# Patient Record
Sex: Female | Born: 1995 | Marital: Single
Health system: Southern US, Community
[De-identification: ages and names within clinical notes are randomized; demographics above are authoritative.]

---
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
## 2013-02-14 ENCOUNTER — Emergency Department: Payer: Self-pay | Admitting: Emergency Medicine

## 2013-03-16 ENCOUNTER — Inpatient Hospital Stay: Payer: Self-pay | Admitting: Internal Medicine

## 2013-03-16 LAB — URINALYSIS, COMPLETE
Bilirubin,UR: NEGATIVE
Glucose,UR: >=500 mg/dL (ref 0–75)
Leukocyte Esterase: NEGATIVE
Nitrite: NEGATIVE
Ph: 5 (ref 4.5–8.0)
Squamous Epithelial: 1
WBC UR: 4 /HPF (ref 0–5)

## 2013-03-16 LAB — COMPREHENSIVE METABOLIC PANEL
Albumin: 4.4 g/dL (ref 3.8–5.6)
Alkaline Phosphatase: 241 U/L — ABNORMAL HIGH
BUN: 17 mg/dL (ref 9–21)
Bilirubin,Total: 0.3 mg/dL (ref 0.2–1.0)
Calcium, Total: 8.3 mg/dL — ABNORMAL LOW (ref 9.0–10.7)
Chloride: 98 mmol/L (ref 97–107)
Co2: <5 mmol/L — CL (ref 16–25)
Creatinine: 1.1 mg/dL (ref 0.60–1.30)
EGFR (African American): >60
EGFR (Non-African Amer.): >60
Osmolality: 281 (ref 275–301)
Potassium: 4.9 mmol/L — ABNORMAL HIGH (ref 3.3–4.7)
SGPT (ALT): 33 U/L (ref 12–78)
Sodium: 126 mmol/L — ABNORMAL LOW (ref 132–141)
Total Protein: 8.4 g/dL (ref 6.4–8.6)

## 2013-03-16 LAB — CBC
HCT: 49.9 % — ABNORMAL HIGH (ref 35.0–47.0)
HGB: 15.7 g/dL (ref 12.0–16.0)
MCH: 28 pg (ref 26.0–34.0)
RBC: 5.63 10*6/uL — ABNORMAL HIGH (ref 3.80–5.20)
RDW: 13.1 % (ref 11.5–14.5)

## 2013-03-16 LAB — PREGNANCY, URINE: Pregnancy Test, Urine: NEGATIVE m[IU]/mL

## 2013-03-17 LAB — DRUG SCREEN, URINE
Barbiturates, Ur Screen: NEGATIVE (ref ?–200)
Cocaine Metabolite,Ur ~~LOC~~: NEGATIVE (ref ?–300)
Methadone, Ur Screen: NEGATIVE (ref ?–300)
Opiate, Ur Screen: NEGATIVE (ref ?–300)
Phencyclidine (PCP) Ur S: NEGATIVE (ref ?–25)
Tricyclic, Ur Screen: NEGATIVE (ref ?–1000)

## 2013-03-17 LAB — CBC WITH DIFFERENTIAL/PLATELET
Basophil %: 0.8 %
Eosinophil #: 0 10*3/uL (ref 0.0–0.7)
Eosinophil %: 0.1 %
HCT: 42.7 % (ref 35.0–47.0)
HGB: 14.2 g/dL (ref 12.0–16.0)
Lymphocyte #: 7.6 10*3/uL — ABNORMAL HIGH (ref 1.0–3.6)
Lymphocyte %: 15 %
Monocyte #: 2.1 x10 3/mm — ABNORMAL HIGH (ref 0.2–0.9)
Neutrophil #: 40.6 10*3/uL — ABNORMAL HIGH (ref 1.4–6.5)
Neutrophil %: 79.9 %
RDW: 12.8 % (ref 11.5–14.5)

## 2013-03-17 LAB — BASIC METABOLIC PANEL
Anion Gap: 16 (ref 7–16)
Anion Gap: 14 (ref 7–16)
BUN: 18 mg/dL (ref 9–21)
BUN: 14 mg/dL (ref 9–21)
BUN: 13 mg/dL (ref 9–21)
Calcium, Total: 8 mg/dL — ABNORMAL LOW (ref 9.0–10.7)
Chloride: 113 mmol/L — ABNORMAL HIGH (ref 97–107)
Co2: <5 mmol/L — CL (ref 16–25)
Co2: 9 mmol/L — CL (ref 16–25)
Creatinine: 0.85 mg/dL (ref 0.60–1.30)
Creatinine: 1.02 mg/dL (ref 0.60–1.30)
EGFR (African American): >60
EGFR (African American): >60
EGFR (African American): >60
EGFR (Non-African Amer.): >60
Glucose: 184 mg/dL — ABNORMAL HIGH (ref 65–99)
Osmolality: 274 (ref 275–301)
Sodium: 134 mmol/L (ref 132–141)
Sodium: 137 mmol/L (ref 132–141)

## 2013-03-17 LAB — COMPREHENSIVE METABOLIC PANEL
Calcium, Total: 7.5 mg/dL — ABNORMAL LOW (ref 9.0–10.7)
Chloride: 107 mmol/L (ref 97–107)
Co2: <5 mmol/L — CL (ref 16–25)
Glucose: 313 mg/dL — ABNORMAL HIGH (ref 65–99)
Potassium: 4.6 mmol/L (ref 3.3–4.7)
SGOT(AST): 16 U/L (ref 0–26)
Sodium: 132 mmol/L (ref 132–141)
Total Protein: 7.9 g/dL (ref 6.4–8.6)

## 2013-03-17 LAB — HEMOGLOBIN A1C: Hemoglobin A1C: 16 % — ABNORMAL HIGH (ref 4.2–6.3)

## 2013-03-18 LAB — CBC WITH DIFFERENTIAL/PLATELET
Basophil #: 0 10*3/uL (ref 0.0–0.1)
Basophil %: 0.1 %
Eosinophil %: 0.1 %
HGB: 13.3 g/dL (ref 12.0–16.0)
Lymphocyte %: 4.6 %
MCHC: 33.7 g/dL (ref 32.0–36.0)
Neutrophil #: 17.3 10*3/uL — ABNORMAL HIGH (ref 1.4–6.5)
Neutrophil %: 88 %
Platelet: 274 10*3/uL (ref 150–440)
RDW: 12.8 % (ref 11.5–14.5)
WBC: 19.6 10*3/uL — ABNORMAL HIGH (ref 3.6–11.0)

## 2013-03-18 LAB — BASIC METABOLIC PANEL
Anion Gap: 12 (ref 7–16)
Anion Gap: 19 — ABNORMAL HIGH (ref 7–16)
Anion Gap: 7 (ref 7–16)
BUN: 12 mg/dL (ref 9–21)
BUN: 13 mg/dL (ref 9–21)
BUN: 13 mg/dL (ref 9–21)
BUN: 14 mg/dL (ref 9–21)
Calcium, Total: 8.3 mg/dL — ABNORMAL LOW (ref 9.0–10.7)
Calcium, Total: 9.1 mg/dL (ref 9.0–10.7)
Chloride: 116 mmol/L — ABNORMAL HIGH (ref 97–107)
Chloride: 118 mmol/L — ABNORMAL HIGH (ref 97–107)
Co2: 10 mmol/L — CL (ref 16–25)
Co2: 13 mmol/L — ABNORMAL LOW (ref 16–25)
Creatinine: 1 mg/dL (ref 0.60–1.30)
Creatinine: 0.83 mg/dL (ref 0.60–1.30)
EGFR (African American): >60
EGFR (African American): >60
EGFR (African American): >60
EGFR (Non-African Amer.): >60
EGFR (Non-African Amer.): >60
EGFR (Non-African Amer.): >60
EGFR (Non-African Amer.): >60
Glucose: 205 mg/dL — ABNORMAL HIGH (ref 65–99)
Glucose: 340 mg/dL — ABNORMAL HIGH (ref 65–99)
Osmolality: 282 (ref 275–301)
Osmolality: 286 (ref 275–301)
Osmolality: 289 (ref 275–301)
Osmolality: 298 (ref 275–301)
Potassium: 3 mmol/L — ABNORMAL LOW (ref 3.3–4.7)
Potassium: 3.5 mmol/L (ref 3.3–4.7)
Sodium: 141 mmol/L (ref 132–141)

## 2013-03-18 LAB — URINE CULTURE

## 2013-03-19 LAB — CBC WITH DIFFERENTIAL/PLATELET
Basophil #: 0 10*3/uL (ref 0.0–0.1)
Eosinophil %: 0.7 %
HGB: 12.1 g/dL (ref 12.0–16.0)
Lymphocyte #: 3.1 10*3/uL (ref 1.0–3.6)
MCH: 28.4 pg (ref 26.0–34.0)
MCHC: 35 g/dL (ref 32.0–36.0)
MCV: 81 fL (ref 80–100)
Neutrophil %: 66.8 %
RBC: 4.25 10*6/uL (ref 3.80–5.20)
RDW: 12.9 % (ref 11.5–14.5)

## 2013-03-19 LAB — BASIC METABOLIC PANEL
Anion Gap: 11 (ref 7–16)
BUN: 12 mg/dL (ref 9–21)
Calcium, Total: 8.9 mg/dL — ABNORMAL LOW (ref 9.0–10.7)
Co2: 16 mmol/L (ref 16–25)
EGFR (African American): >60
EGFR (Non-African Amer.): >60
Potassium: 3.3 mmol/L (ref 3.3–4.7)

## 2013-03-20 LAB — BASIC METABOLIC PANEL
BUN: 14 mg/dL (ref 9–21)
Calcium, Total: 8.6 mg/dL — ABNORMAL LOW (ref 9.0–10.7)
Co2: 25 mmol/L (ref 16–25)
EGFR (African American): >60
Potassium: 3.4 mmol/L (ref 3.3–4.7)
Sodium: 140 mmol/L (ref 132–141)

## 2013-03-21 LAB — CULTURE, BLOOD (SINGLE)

## 2013-03-22 LAB — CULTURE, BLOOD (SINGLE)

## 2013-04-17 ENCOUNTER — Emergency Department: Payer: Self-pay | Admitting: Emergency Medicine

## 2013-04-22 ENCOUNTER — Inpatient Hospital Stay: Payer: Self-pay | Admitting: Surgery

## 2013-04-22 LAB — COMPREHENSIVE METABOLIC PANEL
ALK PHOS: 368 U/L — AB
Albumin: 2.7 g/dL — ABNORMAL LOW (ref 3.8–5.6)
Anion Gap: 14 (ref 7–16)
BUN: 15 mg/dL (ref 7–18)
Bilirubin,Total: 0.3 mg/dL (ref 0.2–1.0)
CALCIUM: 9.5 mg/dL (ref 9.0–10.7)
Chloride: 96 mmol/L — ABNORMAL LOW (ref 98–107)
Co2: 17 mmol/L — ABNORMAL LOW (ref 21–32)
Creatinine: 0.64 mg/dL (ref 0.60–1.30)
EGFR (African American): >60
EGFR (Non-African Amer.): >60
GLUCOSE: 443 mg/dL — AB (ref 65–99)
OSMOLALITY: 275 (ref 275–301)
POTASSIUM: 3 mmol/L — AB (ref 3.5–5.1)
SGOT(AST): 17 U/L (ref 0–26)
SGPT (ALT): 20 U/L (ref 12–78)
Sodium: 127 mmol/L — ABNORMAL LOW (ref 136–145)
TOTAL PROTEIN: 7.9 g/dL (ref 6.4–8.6)

## 2013-04-22 LAB — URINALYSIS, COMPLETE
Bacteria: NONE SEEN
Bilirubin,UR: NEGATIVE
Blood: NEGATIVE
Glucose,UR: >=500 mg/dL (ref 0–75)
Leukocyte Esterase: NEGATIVE
Nitrite: NEGATIVE
Ph: 5 (ref 4.5–8.0)
Protein: NEGATIVE
RBC,UR: <1 /HPF (ref 0–5)
SQUAMOUS EPITHELIAL: NONE SEEN
Specific Gravity: 1.03 (ref 1.003–1.030)
WBC UR: <1 /HPF (ref 0–5)

## 2013-04-22 LAB — CBC WITH DIFFERENTIAL/PLATELET
BASOS PCT: 0.6 %
Basophil #: 0.1 10*3/uL (ref 0.0–0.1)
EOS ABS: 0.1 10*3/uL (ref 0.0–0.7)
EOS PCT: 1 %
HCT: 40.5 % (ref 35.0–47.0)
HGB: 13.9 g/dL (ref 12.0–16.0)
LYMPHS PCT: 14.5 %
Lymphocyte #: 1.9 10*3/uL (ref 1.0–3.6)
MCH: 28.1 pg (ref 26.0–34.0)
MCHC: 34.3 g/dL (ref 32.0–36.0)
MCV: 82 fL (ref 80–100)
Monocyte #: 0.8 x10 3/mm (ref 0.2–0.9)
Monocyte %: 5.8 %
NEUTROS ABS: 10.3 10*3/uL — AB (ref 1.4–6.5)
Neutrophil %: 78.1 %
PLATELETS: 524 10*3/uL — AB (ref 150–440)
RBC: 4.94 10*6/uL (ref 3.80–5.20)
RDW: 13.3 % (ref 11.5–14.5)
WBC: 13.2 10*3/uL — AB (ref 3.6–11.0)

## 2013-04-23 LAB — CBC WITH DIFFERENTIAL/PLATELET
BASOS PCT: 0.4 %
Basophil #: 0 10*3/uL (ref 0.0–0.1)
EOS ABS: 0.1 10*3/uL (ref 0.0–0.7)
Eosinophil %: 1.1 %
HCT: 31.5 % — ABNORMAL LOW (ref 35.0–47.0)
HGB: 10.9 g/dL — AB (ref 12.0–16.0)
LYMPHS PCT: 14.9 %
Lymphocyte #: 1.7 10*3/uL (ref 1.0–3.6)
MCH: 28.1 pg (ref 26.0–34.0)
MCHC: 34.6 g/dL (ref 32.0–36.0)
MCV: 81 fL (ref 80–100)
Monocyte #: 1.5 x10 3/mm — ABNORMAL HIGH (ref 0.2–0.9)
Monocyte %: 12.7 %
Neutrophil #: 8.2 10*3/uL — ABNORMAL HIGH (ref 1.4–6.5)
Neutrophil %: 70.9 %
Platelet: 420 10*3/uL (ref 150–440)
RBC: 3.88 10*6/uL (ref 3.80–5.20)
RDW: 13.3 % (ref 11.5–14.5)
WBC: 11.5 10*3/uL — ABNORMAL HIGH (ref 3.6–11.0)

## 2013-04-23 LAB — HEPATIC FUNCTION PANEL A (ARMC)
Albumin: 2 g/dL — ABNORMAL LOW (ref 3.8–5.6)
Alkaline Phosphatase: 193 U/L — ABNORMAL HIGH
Bilirubin, Direct: <0.1 mg/dL (ref 0.00–0.20)
Bilirubin,Total: 0.3 mg/dL (ref 0.2–1.0)
SGOT(AST): 15 U/L (ref 0–26)
SGPT (ALT): 15 U/L (ref 12–78)
TOTAL PROTEIN: 5.6 g/dL — AB (ref 6.4–8.6)

## 2013-04-23 LAB — BASIC METABOLIC PANEL
ANION GAP: 9 (ref 7–16)
BUN: 9 mg/dL (ref 7–18)
CALCIUM: 8 mg/dL — AB (ref 9.0–10.7)
CHLORIDE: 105 mmol/L (ref 98–107)
Co2: 22 mmol/L (ref 21–32)
Creatinine: 0.41 mg/dL — ABNORMAL LOW (ref 0.60–1.30)
EGFR (African American): >60
EGFR (Non-African Amer.): >60
Glucose: 214 mg/dL — ABNORMAL HIGH (ref 65–99)
OSMOLALITY: 277 (ref 275–301)
POTASSIUM: 3.1 mmol/L — AB (ref 3.5–5.1)
SODIUM: 136 mmol/L (ref 136–145)

## 2013-04-23 LAB — MAGNESIUM: MAGNESIUM: 1.6 mg/dL — AB

## 2013-04-24 LAB — BASIC METABOLIC PANEL
Anion Gap: 6 — ABNORMAL LOW (ref 7–16)
BUN: 4 mg/dL — AB (ref 7–18)
CHLORIDE: 99 mmol/L (ref 98–107)
CREATININE: 0.43 mg/dL — AB (ref 0.60–1.30)
Calcium, Total: 8.4 mg/dL — ABNORMAL LOW (ref 9.0–10.7)
Co2: 30 mmol/L (ref 21–32)
EGFR (African American): >60
EGFR (Non-African Amer.): >60
Glucose: 224 mg/dL — ABNORMAL HIGH (ref 65–99)
Osmolality: 274 (ref 275–301)
Potassium: 3.4 mmol/L — ABNORMAL LOW (ref 3.5–5.1)
SODIUM: 135 mmol/L — AB (ref 136–145)

## 2013-04-28 LAB — WOUND CULTURE

## 2013-05-02 ENCOUNTER — Observation Stay: Payer: Self-pay | Admitting: Surgery

## 2013-05-02 LAB — HCG, QUANTITATIVE, PREGNANCY

## 2013-05-03 LAB — CBC WITH DIFFERENTIAL/PLATELET
Basophil #: 0 10*3/uL (ref 0.0–0.1)
Basophil %: 0.3 %
EOS PCT: 0 %
Eosinophil #: 0 10*3/uL (ref 0.0–0.7)
HCT: 35.7 % (ref 35.0–47.0)
HGB: 11.7 g/dL — ABNORMAL LOW (ref 12.0–16.0)
LYMPHS ABS: 1.6 10*3/uL (ref 1.0–3.6)
Lymphocyte %: 13.7 %
MCH: 27.5 pg (ref 26.0–34.0)
MCHC: 32.6 g/dL (ref 32.0–36.0)
MCV: 84 fL (ref 80–100)
MONOS PCT: 2.6 %
Monocyte #: 0.3 x10 3/mm (ref 0.2–0.9)
NEUTROS ABS: 9.4 10*3/uL — AB (ref 1.4–6.5)
NEUTROS PCT: 83.4 %
Platelet: 522 10*3/uL — ABNORMAL HIGH (ref 150–440)
RBC: 4.24 10*6/uL (ref 3.80–5.20)
RDW: 13.5 % (ref 11.5–14.5)
WBC: 11.3 10*3/uL — AB (ref 3.6–11.0)

## 2013-05-03 LAB — BASIC METABOLIC PANEL
ANION GAP: 16 (ref 7–16)
BUN: 11 mg/dL (ref 7–18)
CALCIUM: 9 mg/dL (ref 9.0–10.7)
CO2: 17 mmol/L — AB (ref 21–32)
Chloride: 99 mmol/L (ref 98–107)
Creatinine: 0.48 mg/dL — ABNORMAL LOW (ref 0.60–1.30)
EGFR (African American): >60
EGFR (Non-African Amer.): >60
Glucose: 437 mg/dL — ABNORMAL HIGH (ref 65–99)
Osmolality: 283 (ref 275–301)
Potassium: 4.2 mmol/L (ref 3.5–5.1)
SODIUM: 132 mmol/L — AB (ref 136–145)

## 2013-05-25 ENCOUNTER — Emergency Department: Payer: Self-pay | Admitting: Emergency Medicine

## 2013-05-29 LAB — CBC WITH DIFFERENTIAL/PLATELET
BASOS PCT: 1.1 %
Basophil #: 0 10*3/uL (ref 0.0–0.1)
EOS ABS: 0.1 10*3/uL (ref 0.0–0.7)
Eosinophil %: 1.3 %
HCT: 41.1 % (ref 35.0–47.0)
HGB: 13.2 g/dL (ref 12.0–16.0)
LYMPHS ABS: 1.7 10*3/uL (ref 1.0–3.6)
Lymphocyte %: 38.2 %
MCH: 29.6 pg (ref 26.0–34.0)
MCHC: 32 g/dL (ref 32.0–36.0)
MCV: 93 fL (ref 80–100)
Monocyte #: 0.4 x10 3/mm (ref 0.2–0.9)
Monocyte %: 8 %
NEUTROS PCT: 51.4 %
Neutrophil #: 2.3 10*3/uL (ref 1.4–6.5)
Platelet: 310 10*3/uL (ref 150–440)
RBC: 4.44 10*6/uL (ref 3.80–5.20)
RDW: 14.3 % (ref 11.5–14.5)
WBC: 4.6 10*3/uL (ref 3.6–11.0)

## 2013-05-29 LAB — BASIC METABOLIC PANEL
Anion Gap: 9 (ref 7–16)
BUN: 12 mg/dL (ref 7–18)
Calcium, Total: 9.1 mg/dL (ref 9.0–10.7)
Chloride: 92 mmol/L — ABNORMAL LOW (ref 98–107)
Co2: 23 mmol/L (ref 21–32)
Creatinine: 0.75 mg/dL (ref 0.60–1.30)
Glucose: 873 mg/dL (ref 65–99)
Osmolality: 292 (ref 275–301)
Potassium: 4 mmol/L (ref 3.5–5.1)
Sodium: 124 mmol/L — ABNORMAL LOW (ref 136–145)

## 2013-05-29 LAB — COMPREHENSIVE METABOLIC PANEL
ANION GAP: 8 (ref 7–16)
AST: 27 U/L — AB (ref 0–26)
Albumin: 3.6 g/dL — ABNORMAL LOW (ref 3.8–5.6)
Alkaline Phosphatase: 285 U/L — ABNORMAL HIGH
BILIRUBIN TOTAL: 0.4 mg/dL (ref 0.2–1.0)
BUN: 11 mg/dL (ref 7–18)
CHLORIDE: 88 mmol/L — AB (ref 98–107)
CO2: 24 mmol/L (ref 21–32)
Calcium, Total: 9.2 mg/dL (ref 9.0–10.7)
Creatinine: 0.96 mg/dL (ref 0.60–1.30)
Glucose: 1086 mg/dL (ref 65–99)
OSMOLALITY: 296 (ref 275–301)
POTASSIUM: 4.2 mmol/L (ref 3.5–5.1)
SGPT (ALT): 29 U/L (ref 12–78)
SODIUM: 120 mmol/L — AB (ref 136–145)
Total Protein: 7.3 g/dL (ref 6.4–8.6)

## 2013-05-29 LAB — URINALYSIS, COMPLETE
BILIRUBIN, UR: NEGATIVE
BLOOD: NEGATIVE
Bacteria: NONE SEEN
Leukocyte Esterase: NEGATIVE
Nitrite: NEGATIVE
PH: 7 (ref 4.5–8.0)
PROTEIN: NEGATIVE
Specific Gravity: 1.026 (ref 1.003–1.030)
WBC UR: 1 /HPF (ref 0–5)

## 2013-05-29 LAB — LIPASE, BLOOD: Lipase: 119 U/L (ref 73–393)

## 2013-05-30 ENCOUNTER — Inpatient Hospital Stay: Payer: Self-pay | Admitting: Internal Medicine

## 2013-05-30 LAB — BASIC METABOLIC PANEL
Anion Gap: 7 (ref 7–16)
BUN: 7 mg/dL (ref 7–18)
CALCIUM: 7.4 mg/dL — AB (ref 9.0–10.7)
CHLORIDE: 110 mmol/L — AB (ref 98–107)
Co2: 26 mmol/L (ref 21–32)
Creatinine: 0.63 mg/dL (ref 0.60–1.30)
EGFR (African American): >60
Glucose: 125 mg/dL — ABNORMAL HIGH (ref 65–99)
Osmolality: 284 (ref 275–301)
Potassium: 3.3 mmol/L — ABNORMAL LOW (ref 3.5–5.1)
SODIUM: 143 mmol/L (ref 136–145)

## 2013-05-30 LAB — HCG, QUANTITATIVE, PREGNANCY

## 2014-07-29 IMAGING — CT CT CHEST-ABD-PELV W/O CM
1 of 3 series · 12 of 31 positions shown, 18 images · non-contrast
Comparison: None

REASON FOR EXAM: (1) came in with resp symptoms, sirs. eval for pna; (2)
abd pain.
COMMENTS:

PROCEDURE:     CT  - CT CHEST ABDOMEN AND PELVIS WO  - November 03, 2012 [DATE]
RESULT:     CT CHEST, ABDOMEN, AND PELVIS
HISTORY: S IRS, abdominal pain
TECHNIQUE: Multiple axial images obtained from the thoracic inlet to the
pubic symphysis, without p.o. contrast and without intravenous contrast.

[Series 6: soft tissue recon · axial · 0.93mm/px · z∈[-1146,-618]mm · 12 of 216 slices shown, 18 images]
[im 20/216  mediastinal]
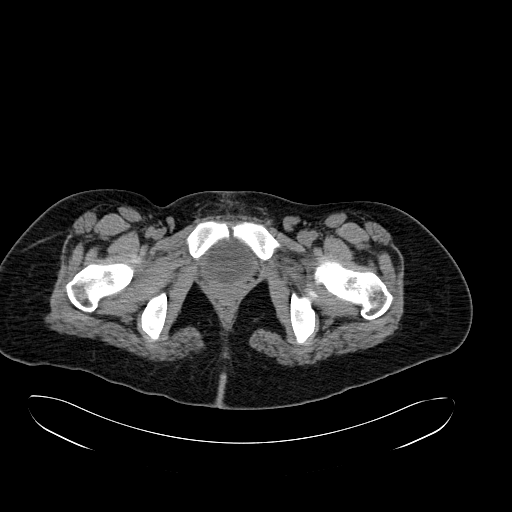
[im 20/216  bone]
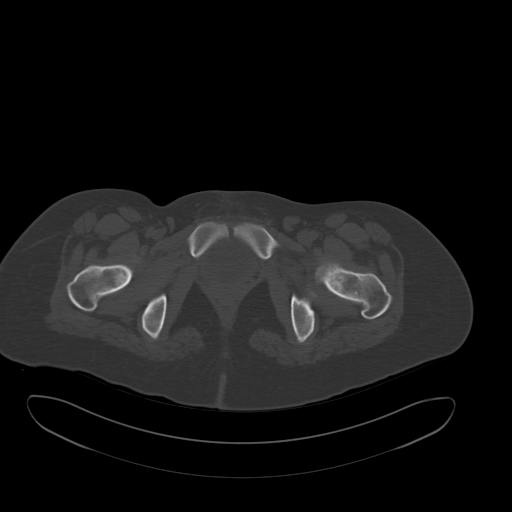
[im 40/216  mediastinal]
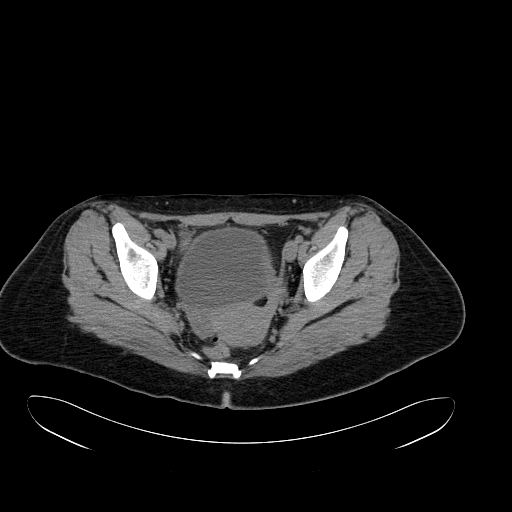
[im 59/216  mediastinal]
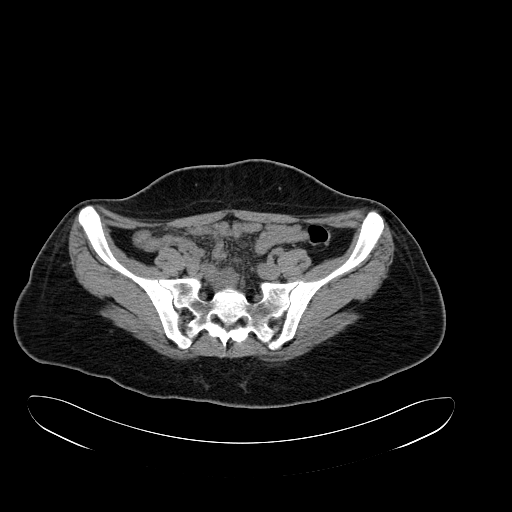
[im 79/216  mediastinal]
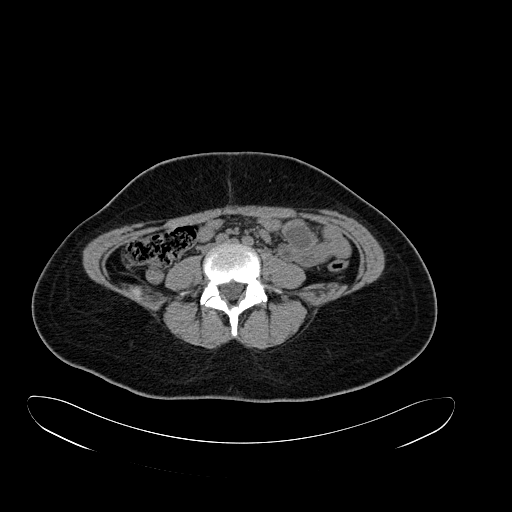
[im 98/216  mediastinal]
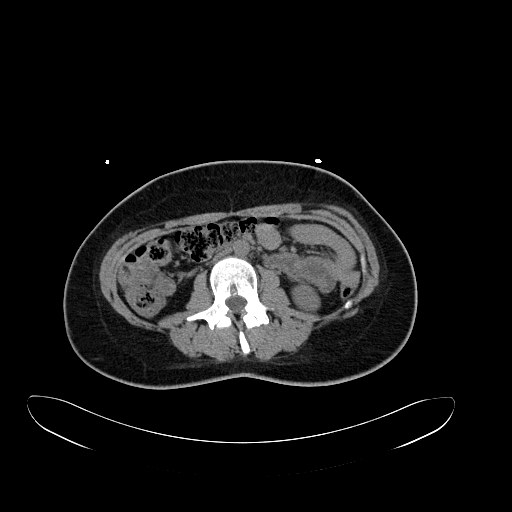
[im 106/216  mediastinal]
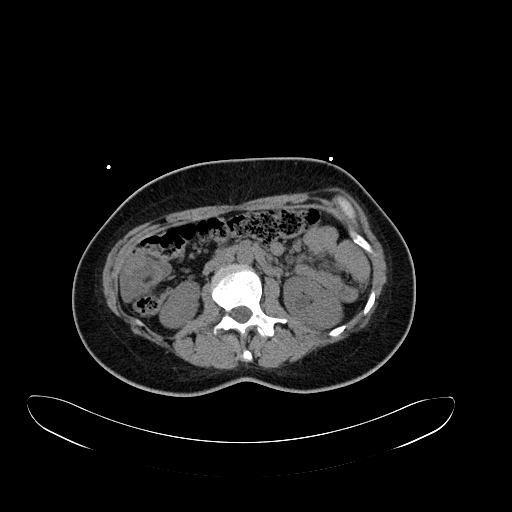
[im 108/216  mediastinal]
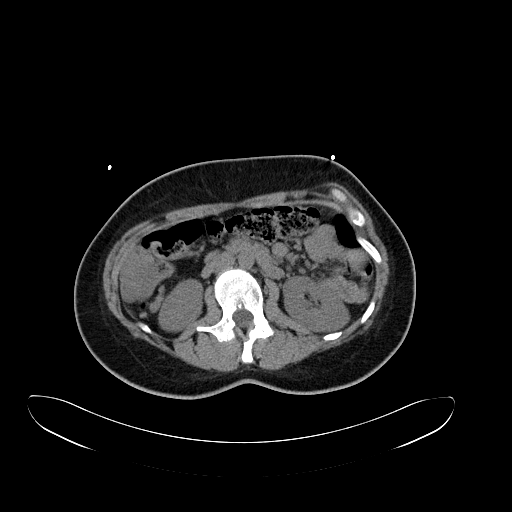
[im 118/216  mediastinal]
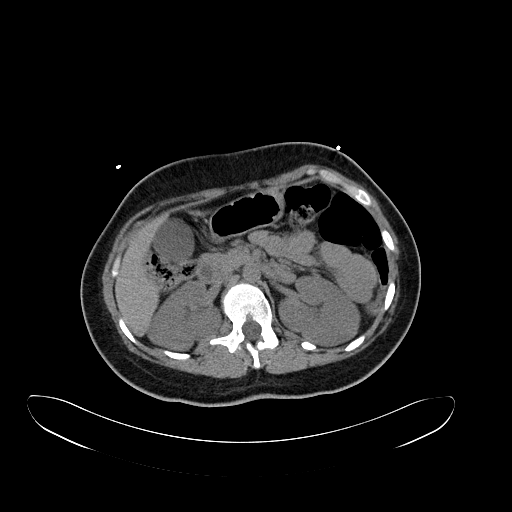
[im 137/216  mediastinal]
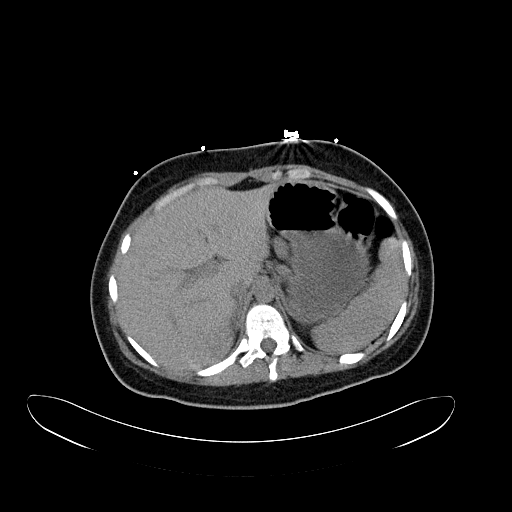
[im 137/216  lung]
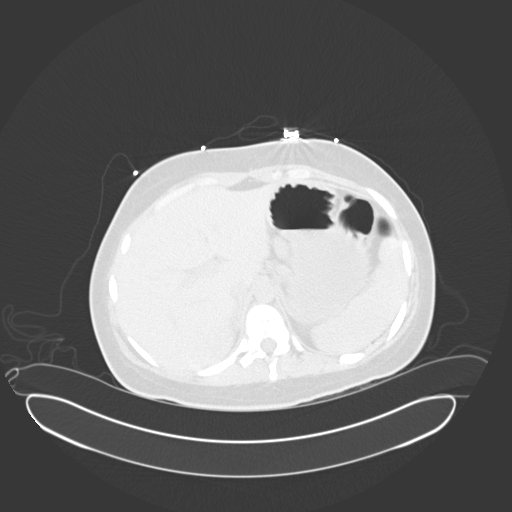
[im 137/216  bone]
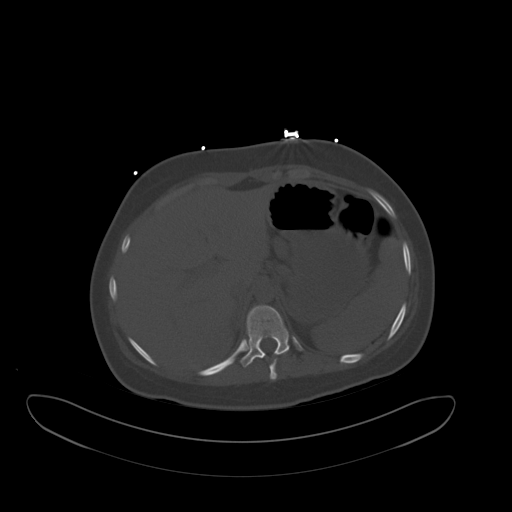
[im 157/216  mediastinal]
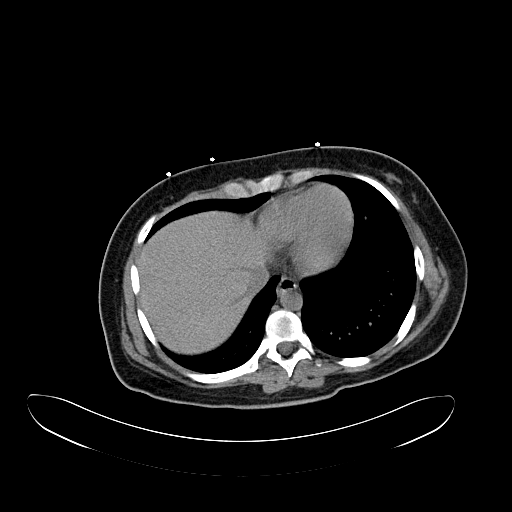
[im 157/216  lung]
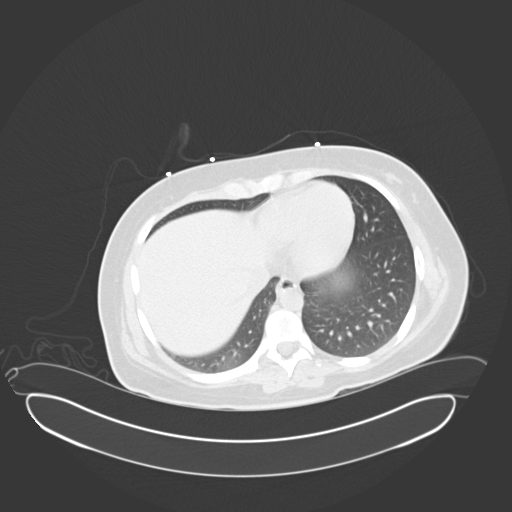
[im 176/216  mediastinal]
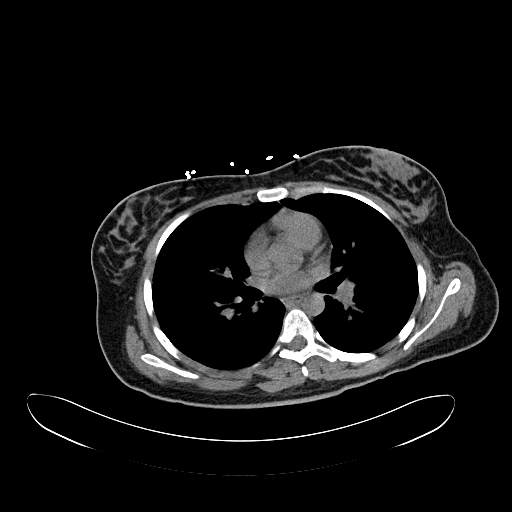
[im 176/216  lung]
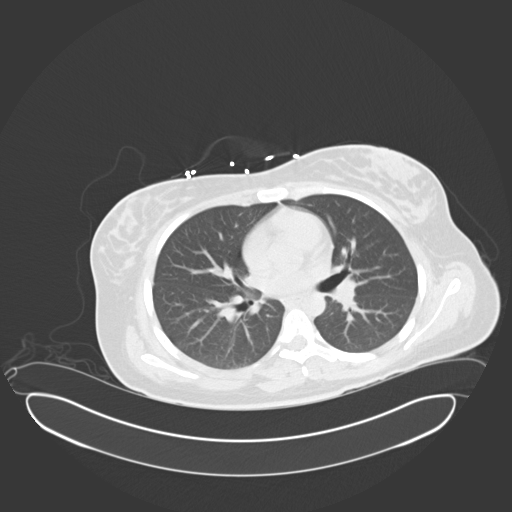
[im 196/216  mediastinal]
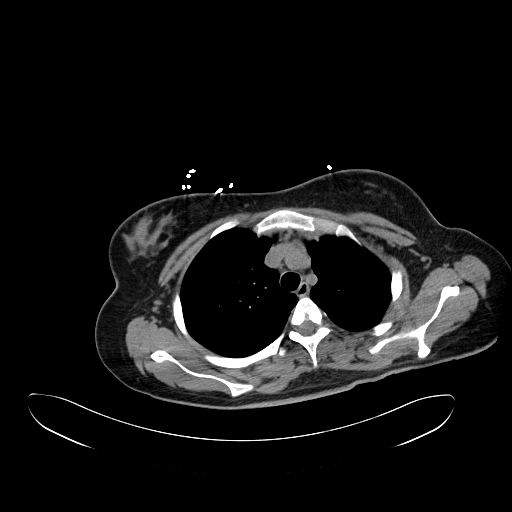
[im 196/216  lung]
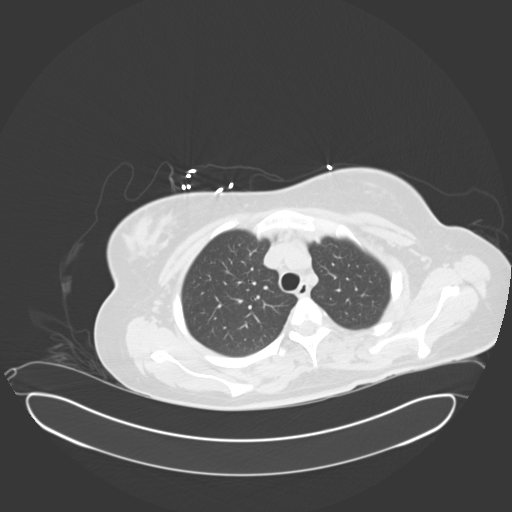

[12 of 31 positions shown; findings below may reference images not displayed]

FINDINGS: CHEST:

The lungs are clear. There is no focal mass. There is no focal parenchymal
opacity, pleural effusion, or pneumothorax.

The heart size is normal. There is no pericardial effusion.

There are no pathologically enlarged mediastinal, hilar, or axillary lymph
nodes.

The osseous structures demonstrate no focal abnormality.

ABDOMEN/PELVIS:

The liver demonstrates no focal abnormality. There is no intrahepatic or
extrahepatic biliary ductal dilatation. The gallbladder is unremarkable. The
spleen demonstrates no focal abnormality. The kidneys, adrenal glands,
pancreas are normal. The bladder is unremarkable.

The stomach, duodenum, small intestine, and large intestine demonstrate no
and gross normality, but evaluation is limited secondary to lack of enteric
contrast.. There is no pneumoperitoneum, pneumatosis, or portal venous gas.
There is no abdominal or pelvic free fluid. There is no lymphadenopathy.

The abdominal aorta is normal in caliber .

The osseous structures are unremarkable.
IMPRESSION: 1. No acute disease of the chest, abdomen or pelvis.

[REDACTED]

## 2014-08-07 NOTE — Discharge Summary (Signed)
PATIENT NAME:  Lisa Swanson, Lisa Swanson MR#:  [id number] DATE OF BIRTH:  [birth year]  DATE OF ADMISSION:  03/16/2013 DATE OF DISCHARGE:  03/20/2013  DISCHARGE DIAGNOSES: 1.  Severe diabetic ketoacidosis, resolved.  2.  Type 1 diabetes mellitus.  3.  Systemic inflammatory response syndrome, resolved.  4.  Hyponatremia, pseudohyponatremia, secondary to hyperglycemia, resolved.  5.  Metabolic acidosis, with diabetic ketoacidosis, resolved.    DISCHARGE MEDICATIONS: Levemir 22 units at bedtime, insulin NovoLog 6 units t.i.d. with meals.   DIET: ADA diet.   CONSULTATIONS: AWendall Mola, MD  HOSPITAL COURSE: This is an 19 year old female patient with history of type 1 diabetes mellitus, admitted on November 30 because of altered mental status. The patient was seen two days before in the ER, and signed out AGAINST MEDICAL ADVICE, and came in because of altered mental status. Blood sugar was 554, and the patient's bicarbonate was less than 5, and pH was 6.92. The patient was admitted to Intensive Care Unit for DKA with severe metabolic acidosis, and received IV fluids and IV insulin drip. The patient's pH was 6.92 on admission. Lipase was 46. She had a sodium of 126, potassium 4.9, bicarbonate less than 5. Initially, the patient was continued on fluids and insulin drip. No bicarbonate was given. The patient was chilly and continued to be lethargic, and the patient still had low bicarbonate of 5 the following day, with 6.92, so at that point, Dr. Wendall Mola recommended bicarbonate drip. The patient was started on bicarbonate drip and she had clinical improvement with IV fluids, bicarbonate drip and insulin drip. Her mental status improved, pH improved. The patient was switched back to NovoLog and also Levemir. The patient had some family issues, and also broke up with her boyfriend; admits to not checking sugars as she should, and also missed some doses of Levemir. The patient had counseling by Dr. Tedd Sias,  and also counseled by me. She is advised to continue on Levemir along with NovoLog. She knows how to check sugars and adjust the insulin. The patient's hemoglobin A1c is 14, which tells [place] that she is a very poorly controlled diabetic. The patient being so, she needs to have proper control of her diabetes. Advised her to follow up with Dr. Tedd Sias. She has an endocrinologist at [facility]. She is unclear as to follow up with either her endocrinologist or Dr. Tedd Sias.   Hypokalemia replaced with IV fluids and IV potassium.   SIRS: The patient's blood cultures have been negative. Urine cultures are also negative for any growth. Chest x-ray was negative initially, thought to have SIRS, with white count 41, and also was very tachycardic in the ER. When she came in, heart rate was 123, temperature of 95.1, so the patient did receive fluids and cultures were done. Septic work-up done. The patient's urine culture showed only 5000 colonies of Streptococcus agalactiae, but blood cultures have been negative and chest x-rays also did not show any infiltrate. She received Rocephin in the hospital, and the patient was discharged home in stable condition.   TIME SPENT, DISCHARGE PREPARATION: More than 30 minutes.   The patient was referred to psychiatric evaluation for depression, but she refused to talk to Dr. Jennet Maduro, and did not want to talk, so psychiatry consult has not been done, because the patient was not cooperating. The patient did not want to talk.    White count did come down to 7.9 on December 3.   DISCHARGE VITAL SIGNS: The patient's blood pressure was 103/67,  pulse is 78, temperature 98.3. The patient's blood pressure fluctuated between 95/61 to 103/67. The patient's condition is stable. Advised her to take things slowly for the first few days to help her with sugars and also encourage her to drink a lot of water.     ____________________________ Katha HammingSnehalatha Merisa Julio, MD sk:cg D: 03/21/2013 19:37:12  ET T: 03/21/2013 22:23:37 ET JOB#: [id number]  cc: Katha HammingSnehalatha Charity Tessier, MD, <Dictator> A. Wendall MolaMelissa Solum, MD Katha HammingSNEHALATHA Dayshon Roback MD ELECTRONICALLY SIGNED 03/26/2013 22:57

## 2014-08-07 NOTE — H&P (Signed)
PATIENT NAME:  Lisa Swanson, Lisa Swanson MR#:  [id number] DATE OF BIRTH:  24-Jun-1995  DATE OF ADMISSION:  08/30/2012  PRIMARY CARE PHYSICIAN: None.   REFERRING PHYSICIAN: Si Raider, PA.   CHIEF COMPLAINT: Chest pain.   HISTORY OF PRESENT ILLNESS: Lisa Swanson is an 19 year old white female with a past medical history of type 1 diabetes mellitus for the last 7 years per the patient with well-controlled blood sugars. Comes to the Emergency Department with complaints of chest pain 2 days back. The patient had workup done in the Emergency Department with CT chest which was unremarkable. Only showed mildly prominent mesenteric lymph nodes. The patient denies having any nausea, vomiting or diarrhea. Denies having any cough, shortness of breath. Denies having any fever. Today, the patient was at work and started to experience severe chest pain associated with shortness of breath. Concerning this, came to the Emergency Department. On workup in the Emergency Department, the patient was found to be in diabetic ketoacidosis with a bicarb of 12, pH of 7.26. The patient was initiated on insulin drip.   PAST MEDICAL HISTORY: Diabetes mellitus, kidney stones.   PAST SURGICAL HISTORY: Stent placement to ureter for kidney stones.   ALLERGIES: IODINE AND PENICILLIN.   HOME MEDICATIONS:  1. Lantus 45 units daily.  2. Humalog subcutaneous as needed.   SOCIAL HISTORY: No history of smoking, drinking alcohol or using illicit drugs. Currently lives with her boyfriend.   FAMILY HISTORY: Both parents are healthy.   REVIEW OF SYSTEMS:  CONSTITUTIONAL: No generalized weakness. No weight loss.  EARS, NOSE, THROAT: No ear pain, tinnitus. No sore throat.  EYES: No change in vision.  CHEST: Has tenderness. No cough, shortness of breath.  CARDIOVASCULAR: No palpitations. No pedal edema.  GASTROINTESTINAL: No nausea, vomiting, abdominal pain.  GENITOURINARY: No dysuria or hematuria.  SKIN: No rash or lesions.   MUSCULOSKELETAL: No swelling or joint pain.  NEUROLOGIC: No numbness or weakness.   PHYSICAL EXAMINATION:  GENERAL: This is a well-built, well-nourished, age-appropriate female lying down in the bed, not in distress.  VITAL SIGNS: Temperature 97.9, pulse 94, blood pressure 109/69, respiratory rate of 20, oxygen saturation 100% on room air.  HEENT: Head normocephalic, atraumatic. There is no scleral icterus. Conjunctivae normal. Pupils equal and react to light. Mucous membranes mild dryness.  NECK: Supple. No lymphadenopathy. No JVD. No carotid bruit. No thyromegaly.  CHEST: Has focal tenderness on the left and right parasternal area.  LUNGS: Bilaterally clear to auscultation.  HEART: S1, S2 regular. No murmurs are heard. No pedal edema. Pulses 2+.  MUSCULOSKELETAL: Good range of motion in all of the extremities.  ABDOMEN: Bowel sounds present. Soft, nontender, nondistended. No hepatosplenomegaly. LYMPHATIC SYSTEM: No axillary or inguinal lymphadenopathy.  NEUROLOGIC: The patient is alert, oriented to place, person and time. Cranial nerves II through XII intact. Motor 5/5 in upper and lower extremities.   LABS: UA negative for nitrites and leukocyte esterase. ABG: PH of 7.21, pCO2 of 25, PaO2 of 223. CBC: WBC of 16, hemoglobin 14.7, platelet count of 468. CMP: Glucose 326, sodium 130, BUN 11, creatinine of 0.76.   ASSESSMENT AND PLAN: Lisa Swanson is an 19 year old female who comes to the Emergency Department with chest pain. She is found to be in diabetic ketoacidosis.  1. Diabetic ketoacidosis: Continue with insulin drip. Currently on D5 half-normal saline as the patient's blood sugars are in 190s. Will check the BMP every 6 hours. Once anion gap closes, will keep the patient on home dose of  the insulin. Quite uncertain about the precipitating factor of this diabetic ketoacidosis.  2. Diabetes mellitus, insulin dependent: Will check the hemoglobin A1c. The patient states blood sugars are well  controlled.  3. Chest pain: This seems to be more of a musculoskeletal pain. The patient has good oxygen saturations.  mildly tachycardic. This could be from the diabetic ketoacidosis. Will check a D-dimer. If elevated, may consider getting a V/Q scan as the patient has allergy to IODINE.   4. Keep the patient on deep vein thrombosis prophylaxis with Lovenox.  Time spent 50min.   ____________________________ Lisa GriffinsPadmaja Nazaire Cordial, MD pv:gb D: 08/30/2012 01:34:16 ET T: 08/30/2012 01:57:26 ET JOB#: [id number]  cc: Lisa GriffinsPadmaja Khush Pasion, MD, <Dictator> Lisa GriffinsPADMAJA Dereona Kolodny MD ELECTRONICALLY SIGNED 08/30/2012 6:15

## 2014-08-07 NOTE — Discharge Summary (Signed)
PATIENT NAME:  Lisa PerchCARWILE, [name] MR#:  [id number] DATE OF BIRTH:  11/09/1995  DATE OF ADMISSION:  08/30/2012 DATE OF DISCHARGE:  08/31/2012  DISCHARGE DIAGNOSES:  1.  Diabetic ketoacidosis.  2.  Chest pain.   CODE STATUS:  FULL CODE.   CONDITION ON DISCHARGE:  Stable.   MEDICINE ON DISCHARGE:    1.  Humalog subcutaneous as needed.  2.  Lantus 45 units subcutaneous once a day,  3.  Pantoprazole 40 mg oral delayed-release tablet 2 times a day.    HOME OXYGEN ON DISCHARGE:  No.   DIET ON DISCHARGE:  Carbohydrate-controlled ADA diet.   DIET CONSISTENCY:  Regular.   ACTIVITY:  As tolerated.   TIMEFRAME TO FOLLOWUP:  Within 1 to 2 weeks.   HISTORY OF PRESENTING ILLNESS:  An 19 year old female with past medical history of type 1 diabetes mellitus for the past 7 years, well-controlled blood sugars; came to the Emergency Department with complaint of chest pain for 2 days. Two days ago, the patient was in the Emergency Room, and a CT chest was done, which was unremarkable, only showed some mild prominent mesenteric lymphadenopathy; was not having any nausea, vomiting, or diarrhea; and so she was discharged home. The patient presented to the hospital again within 2 days because at work she started actually having severe chest pain associated with shortness of breath concerning for this, and so she came to the Emergency Department. On workup, she was found in diabetic ketoacidosis with bicarb of 12 and pH of 7.26, so she was admitted with insulin drip in Critical Care Unit for further management.   HOSPITAL COURSE AND STAY:  DKA. The patient was admitted to CCU with the insulin drip and continuous monitoring of blood sugar and anion gap. With insulin drip, her DKA resolved, and her electrolytes became within normal range. She was started back on her baseline insulin regimen at home, Lantus 45 units, and discharged home with the same.   OTHER MEDICAL ISSUES:  Chest pain. She presented with the  chest pain, which was with shortness of breath. V/Q scan was done to rule out any PE, which was negative. Her troponins remained negative, and the patient had resolution of her chest pain with correction of her DKA also, and so she was discharged home with Protonix.   CONSULTS IN THE HOSPITAL:  None.   LABORATORY RESULTS IN THE HOSPITAL:  PH on presentation 7.21 and PCO2 was 25 in venous blood. Glucose was 101. Creatine was 0.58. CO2 was 13 on presentation. D-dimer was less than 0.2. Troponin was less than 0.02. V/Q scan was negative. Echocardiogram was done, which showed left ventricular ejection fraction 55% to 60%, normal echocardiogram, normal global left ventricular systolic function, no evidence of ischemic primary valvular hypertrophic pulmonary heart disease. Urinalysis was negative. Urine for toxicology was also negative. Hemoglobin 12.9, platelet count 341.   TOTAL TIME SPENT ON THIS DISCHARGE:  45 minutes.    ____________________________ Hope PigeonVaibhavkumar G. Elisabeth PigeonVachhani, MD vgv:ms D: 09/04/2012 21:10:02 ET T: 09/04/2012 21:55:33 ET JOB#: [id number]  cc: Hope PigeonVaibhavkumar G. Elisabeth PigeonVachhani, MD, <Dictator> Altamese DillingVAIBHAVKUMAR Jaquil Todt MD ELECTRONICALLY SIGNED 09/12/2012 14:15

## 2014-08-07 NOTE — Consult Note (Signed)
Chief Complaint and History:  Referring Physician Dr. Juliene PinaMody   Chief Complaint DKA   Allergies:  PCN: Unknown  Iodine: Anaphylaxis  Assessment/Plan:  Assessment/Plan 19 yo F with type 1 DM admitted yesterday with DKA attributed to non-compliance with insulin. Bicarb initially <5 and she had elevated urine ketones. She was started on IV insulin, IVF. Sugars improved. Still acidotic this morning and IV bicarb was added. D5 added ager FSBS normalized. She is responsive only to pain at this time. Currently on D5 at 125/hr and insulin at 0.9 units/hr.   Pt examined and chart reviewed.   A/ 1. SIRS 2. DKA 3. Uncontrolled type 1 diabetes 4. Leukocytosis with no focal source of infection, likely due to DKA/SIRS  P/ Agree with IV insulin and IV bicarb as ordered. Follow lytes closely. Will follow along with you and can help with conversion to SQ insulin once acidosis improved, mental status improved, and AG closed. Do not start a diet until she is off the IV insulin.  Full consult to be dictated.   Electronic Signatures: Raj JanusSolum, Lucius Wise M (MD)  (Signed 01-Dec-14 13:04)  Authored: Chief Complaint and History, ALLERGIES, Assessment/Plan   Last Updated: 01-Dec-14 13:04 by Raj JanusSolum, Estephanie Hubbs M (MD)

## 2014-08-07 NOTE — Discharge Summary (Signed)
PATIENT NAME:  Lisa Swanson, GRAVLIN MR#:  [id number] DATE OF BIRTH:  [birth year]  DATE OF ADMISSION:  11/02/2012 DATE OF DISCHARGE:  11/05/2012  CONSULTANTS: Dr. Cherylann Ratel and Dr. Thedore Mins from nephrology.   PRIMARY CARE PHYSICIAN:  Dr. Burnett Sheng .   CHIEF COMPLAINT:  DKA.   DISCHARGE DIAGNOSES: 1.  Severe diabetic ketoacidosis.  2.  Mixed metabolic acidosis.  3. Systemic inflammatory response syndrome, likely secondary to diabetic ketoacidosis and possible bronchitis.  4.  Type 1 diabetes.  5.  Hypokalemia.  6.  History of methicillin-resistant Staphylococcus aureus.  DISCHARGE MEDICATIONS: 1.  Lantus 45 units once a day at bedtime. 2.  Humalog 13 units with every meal and then add your sliding scale as previously used as needed.  3.  Azithromycin 500 mg once a day for 1 day.   DIET:  ADA diet.   ACTIVITY:  As tolerated.   DISCHARGE INSTRUCTIONS:  Please follow with PCP and also Dr. Tedd Sias within 1 to 2 weeks.   DISPOSITION: Home.  SIGNIFICANT LABORATORIES AND IMAGING: Initial blood glucose was 464 and BMP was 492, BUN 13, creatinine 1.07, initial sodium 131, potassium 4.9, initial serum CO2 less than 5. Hemoglobin A1c was elevated at 12.6. Last potassium of 3.8. Lowest potassium was 2.5.  VBG actually was 6.92 and pCO2 of 24. And the first ABG showed pH of 7.09 on July 20 and pCO2 of 20, pO2 153. Last ABG showed pH of 7.34 on July 21 with pCO2 of 28.  Urinalysis done on arrival did not show evidence for UTI. Blood cultures from July 20 was no growth to date. Urine chloride was elevated 181 and urine sodium was 139. Initial WBC was 33,000 with hematocrit of 15. WBC did normalize on July 22 to 7.4. U-tox was negative. LFTs on arrival showed alkaline phosphatase of 305; otherwise, they were within normal limits.  X-ray of the chest, portable one view on July 19 showed no acute disease of the chest.   CT of chest, abdomen and pelvis without contrast showed no acute disease of chest, abdomen  or pelvis.  Initial beta hydroxybutyrate was greater than 46 and it did trend down to 12 range on July 20.  HISTORY OF PRESENT ILLNESS AND HOSPITAL COURSE:  For full details of H and P, please see the dictation on admission on July 19, but briefly this is a 19 year old female with history of type 1 diabetes and history of MRSA who came into the hospital where the complaints of congestion, cough and sore throat was noted to be tachycardic, dehydrated and found to be in DKA and admitted to the hospitalist service and CCU with insulin drip and aggressive IV fluid resuscitation. She was also initially started on antibiotics for possible bronchitis and upper respiratory infection; however, x-ray of the chest was negative. The patient has significant leukocytosis. She received several boluses of IV fluids, consisting of normal saline and that did close her anion gap briefly but appeared to develop mixed acidosis with anion gap metabolic hyperchloremic acidosis. CO2 remained low and nephrology was consulted and the patient was started on bicarb drip in addition to insulin drip and fluids were changed to one half normal saline with dextrose. She did well.  The patient did have SIRS criteria on admission did undergo CAT scan of chest, abdomen and pelvis which was negative. Currently, acidosis has resolved. The DKA has resolved and her Lantus has been resumed. On further discussion with her, the patient has not been taking her sliding-scale  insulin and does not usually check her blood glucose with a glucometer. She was extensively counseled about the risk and side effects of high blood glucose toxicity and DKA. She possibly has bronchitis and she did get azithromycin and Levaquin here initially but that the Levaquin was held. Blood cultures have been negative and at this point, she will be discharged with some azithromycin. Initially, she did have hypokalemia and with insulin drip that resolved and the patient became  hypokalemic and needed replacement of the potassium and at this point it has been replaced. She was counseled to follow with Dr. Tedd SiasSolum as an outpatient and she will.  TOTAL TIME SPENT: 35 minutes.  CODE STATUS:  THE PATIENT IS FULL CODE.    ____________________________ Krystal EatonShayiq Margia Wiesen, MD sa:ce D: 11/06/2012 08:37:00 ET T: 11/06/2012 10:05:58 ET JOB#: [id number]  cc: Krystal EatonShayiq Damyn Weitzel, MD, <Dictator> Rhona LeavensJames F. Burnett ShengHedrick, MD  Krystal EatonSHAYIQ Freemon Binford MD ELECTRONICALLY SIGNED 11/17/2012 11:40

## 2014-08-07 NOTE — Consult Note (Signed)
PATIENT NAME:  Lisa Swanson, Lisa Swanson MR#:  [id number] DATE OF BIRTH:  Feb 15, 1996  DATE OF CONSULTATION:  03/17/2013  DICTATING CONSULTANT:   Macy Mis, MD  REQUESTING PROVIDER:  Adrian Saran, MD  CHIEF COMPLAINT:  Diabetic ketoacidosis.  REPORT OF CONSULTATION: This is an 19 year old female who presented to the ER yesterday after having not taken her insulin in some time, with an initial blood sugar of 554. She had been apparently lethargic. There had been some social issues, including problems with family and breaking up with her boyfriend. It seems intentional that she had been not taking her insulin. An initial blood sugar was 576, bicarb was undetectable at less than 5, anion gap was not able to be calculated. Her potassium was 4.9. She had a white blood cell count of 41,000, and she was admitted and started on IV fluid and IV insulin. Blood sugars improved, and overnight dextrose was added to her fluids. Due to persistent acidosis, bicarb was added this morning. The patient is still fairly stuporous and unable to provide a history. History obtained from chart review.   The patient was diagnosed with diabetes in 2009. I believe she was following with an endocrinologist at [facility] in [place]. Notes from [facility] showed that her primary care provider is Dr. Burnett Sheng, and she has not been in to see him in over a year. She had been seen at [facility] in November 2013, at which time her hemoglobin A1c was greater than 14%. She was advised to take Lantus 45 units daily, in addition to NovoLog 1 unit per 10 grams of carbohydrates, plus an additional 1 gram per sugar of 50/150. I do not believe Jannis has any known complications from diabetes. Of note, she was hospitalized in May and July of this year with diabetic ketoacidosis at [facility]. No family was available at the bedside for further history.   PAST MEDICAL HISTORY: 1.  Type 1 diabetes.  2.  Recurrent DKA.   ALLERGIES:  IODINE and  PENICILLIN.   SURGICAL HISTORY:  Not known.   MEDICATIONS:  1.  Lantus 45 units at bedtime.  2.  NovoLog before meals, dosing per HPI.  SOCIAL HISTORY: The patient apparently had been living with some friends. Per records, there is no use of tobacco or alcohol or drug abuse.   FAMILY HISTORY:  Not able to obtain.   PHYSICAL EXAMINATION: VITAL SIGNS: Height 65.9 inches, weight 157.9 pounds, BMI 25.5, temp 98.2, pulse 112, BP 136/80, pulse ox 100%.  GENERAL:  A well-developed white female, responds to pain but not to voice, not agitated.  HEENT:  Mucous membranes are moist.  NECK:  Without masses.  CARDIAC:  Tachycardia, without murmur. DP pulses 2+ bilaterally.  PULMONARY: Tachypneic, clear bilaterally.  ABDOMEN:  Soft. Positive bowel sounds.  GENITOURINARY:  Foley catheter in place.  EXTREMITIES:  No edema is present.    SKIN:  No rash or recent skin changes.   LABORATORY:  Glucose 184, BUN 14, creatinine 0.85, sodium 137, potassium 4.2, CO2 of 7, chloride 114, calcium 8.2. Urine drug screen negative. Urine pregnancy test negative. WBC 50, hematocrit 42.7, platelets 411. Blood culture x 2 negative. Chest x-ray showed no acute or active disease.   ASSESSMENT: 1.  Diabetic ketoacidosis.  2.  Severely uncontrolled type 1 diabetes, with history of recurrent acidosis.  3.  Medication noncompliance.  4.  Systemic inflammatory response syndrome (elevated WBC, tachycardia, tachypnea).   PLAN:  1.   Agree with IV insulin, per  hyperglycemia protocol.  1.   Continue IV insulin until anion gap has closed, and then convert to subcu insulin and stop the IV dextrose.  2.   As her clinical status improves, she may want to eat. I would prefer she be kept n.p.o. until we have converted her to subcu insulin.  3.   Once clinical status has improved, it would be helpful to have her see the diabetic educators. Clearly she will need thorough counseling about medication noncompliance.  4.   Currently,  patient is critically ill and at risk for organ failure. She will be followed closely, and I will follow along with you.  ____________________________ A. Wendall MolaMelissa Moneisha Vosler, MD ams:mr D: 03/17/2013 16:58:05 ET T: 03/17/2013 19:19:07 ET JOB#: [id number] cc: A. Wendall MolaMelissa Sritha Chauncey, MD, <Dictator> Macy MisA. MELISSA Alayah Knouff MD ELECTRONICALLY SIGNED 03/24/2013 13:09

## 2014-08-07 NOTE — Consult Note (Signed)
Brief Consult Note: Patient was seen by consultant.   Comments: Ms. Lisa Swanson is asleep. Unable to interview.  Electronic Signatures: Kristine LineaPucilowska, Ege Muckey (MD)  (Signed 01-Dec-14 15:12)  Authored: Brief Consult Note   Last Updated: 01-Dec-14 15:12 by Kristine LineaPucilowska, Chalmers Iddings (MD)

## 2014-08-07 NOTE — H&P (Signed)
PATIENT NAME:  Lisa Swanson, Lisa Swanson MR#:  [id number] DATE OF BIRTH:  [birth year]  DATE OF ADMISSION:  03/16/2013  REFERRING PHYSICIAN: Dr. Jene Every.  PRIMARY CARE PHYSICIAN: Dr. Lockie Pares.   REASON FOR ADMISSION: Altered mental status, diabetic ketoacidosis, systemic inflammatory response syndrome.   HISTORY OF PRESENT ILLNESS: This is a very nice, 19 year old female with history of type 1, who diabetes presented to the Emergency Department a couple of days ago with diabetic ketoacidosis but left AGAINST MEDICAL ADVICE. Apparently, the patient broke up with her boyfriend and stopped taking insulin. Right now, when the patient presented to the Emergency Department, her blood sugar is 554, and she is severely acidotic with a CO2 in serum less than 5 and a pH of 6.92. The patient is here in the company of a gentleman who is states that he is the father of her best friend, and she just moved to live with them for the past two months. She has been living with them due to family problems. As far as I can collect from the surgery, there has not been any physical or sexual abuse in the house; it has just been problems with her stepfather. The patient comes here with altered mental status, not able to respond to questions. The patient admitted for diabetic ketoacidosis treatment.   REVIEW OF SYSTEMS: Unable to obtain review of systems due to altered mental status. Apparently, the patient has told people that she has blood whenever she wipes, and she also has some urinary discomfort. Her urinalysis does not show as much as far as signs of urinary tract infection. Her white count is 4, and her red blood cells are 5, but the patient septic, with a white count of 41, a temperature of 95, very tachycardic. Most of these findings could be related to her diabetic ketoacidosis, but since she has been in and out of the hospital, we are going to cover her with broad-spectrum antibiotics and to stop them if she does not  show anything on her cultures. Blood cultures are going to be sent. Urine culture is going to be sent as well. Her chest x-ray did not look like there was any acute infection or pneumonia.   PAST MEDICAL HISTORY: 1.  Type 1 diabetes.  2.  History of previous MRSA infection.   MEDICATIONS: Lantus 45 units at bedtime, Humalog sliding scale with meals.   ALLERGIES: IODINE AND PENICILLIN. PENICILLIN GAVE HER RASH AND SWELLING BUT NO UPPER RESPIRATORY SYMPTOMS.   SOCIAL HISTORY: Negative for alcohol, tobacco. No drug abuse. The patient is sexually active. She is on her menstrual period right now.   FAMILY HISTORY: Positive for diabetes; otherwise unable to obtain any further.,  PHYSICAL EXAMINATION: VITAL SIGNS: Blood pressure of 109/87, pulse 123, respirations 24, temperature 95.1 rectal, oxygen saturation 99% on room air.  GENERAL: The patient is lethargic. Answers questions yes and no, moving her head as she is not verbal most of the time. She grunts.  HEENT: Her pupils are equal and reactive. Extraocular movements are intact. Mucosa is very dry. Anicteric sclerae. Pink conjunctivae. No oral lesions. No oropharyngeal exudates.  NECK: Supple. No JVD. No thyromegaly. No adenopathy. No carotid bruits. No rigidity. No meningeal signs.  CARDIOVASCULAR: Regular rate and rhythm, tachycardic. No murmurs, rubs or gallops. No displacement of PMI.  LUNGS: Clear without any wheezing or crepitus. No use of accessory muscles.  ABDOMEN: Soft, nontender, nondistended. No hepatosplenomegaly. No masses. Bowel sounds are positive.  GENITAL: Negative for external  lesions. There is some blood from her menstrual period.  RECTAL: No blood per rectum.  EXTREMITIES: No edema, cyanosis or clubbing.  VASCULAR: Pulses +2. Capillary refill is about 5 seconds.  SKIN: No rashes, but there is mottling at the level of the legs. The patient is very cold.  NEUROLOGIC: Cranial nerves II through XII mostly intact. The patient  is awake enough to follow commands, but she is not necessarily verbal. She is having chills and having some tremor due to the cold.  PSYCHIATRIC: Unable to assess due to altered mental status.  LYMPHATIC: Negative for lymphadenopathy in neck or supraclavicular areas.  MUSCULOSKELETAL: No joint effusions or significant erythema on top of joints.   LABORATORY DATA: Blood sugar 554, creatinine 1.1, sodium 126, potassium 4.9, CO2 less than 5, calcium 8.3, lipase 46. LFTs with slight elevation of alkaline phosphatase at 241. White blood count 41,000. Her previous white blood whenever she came in with the diabetic ketoacidosis in September was 7.5. Her urinalysis for drugs at that time showed negative for any significant drugs. She had a white blood cell count up to 33,000 at some point. Urinalysis: 4 white blood cells, 5 red blood cells, done through I and O. pH 6.92, pCO2 of 26.   ASSESSMENT AND PLAN: An 19 year old female with severe diabetic ketoacidosis and signs of sepsis, stopped taking her insulin due to problems with her boyfriend.  1.  Diabetic ketoacidosis. The patient is admitted to the Critical Care Unit. Fluids, normal saline at 250 an hour, insulin drip at 5 units and adjust as necessary. As soon as her blood sugars are below 200, we are going to change IV fluids to D5 normal saline and continue the drip until her and anion gap closes. We are going to do an ABG to follow up at midnight. The patient is going to be at 5 units per hour right now, as she is already dropping her blood sugars at a rate of 100 an hour, which I want to stay less than that if possible, just a slow rate. The patient is going to be started back on her regular Lantus once she is out of DKA.  2.  The patient is acidotic. If her ABG on the next draw, if her pH is less than 6.5, we are going to do a bicarbonate drip but at this moment I think she just needs fluid expansion. The patient is critically ill, looks very sick.  3.   Systemic inflammatory response syndrome, evidence of temperature of 95, white count on 41, tachycardic and tachypneic. This could be just related to diabetic ketoacidosis, but since the patient has been in and out of the hospital and is having some urinary symptoms, we are going to cover her with Rocephin and vancomycin one dose, since the patient has history of previous MRSA infection. If her white blood cells come down and she is not febrile, and her temperature just returns to normal, we are going to stop antibiotics. 4.  Hyponatremia, likely due to intravascular volume depletion, as in pseudohyponatremia, as the patient is hyperglycemic. Corrected is around 135 to 133.  5.  Hyperkalemia. It will correct with the insulin. No need to replace potassium or correct potassium at this moment.  6.  Other medical problems are stable.   TIME SPENT: I spent about 55 minutes with this patient today, critical care time.    ____________________________ Felipa Furnaceoberto Sanchez Gutierrez, MD rsg:cg D: 03/16/2013 23:10:01 ET T: 03/16/2013 23:36:40 ET JOB#: [id number]  cc: Felipa Furnace, MD, <Dictator> Mariza Bourget Juanda Chance MD ELECTRONICALLY SIGNED 03/27/2013 18:05

## 2014-08-07 NOTE — Consult Note (Signed)
Brief Consult Note: Diagnosis: DKA, poor treatment compliance.   Patient was seen by consultant.   Comments: Ms. Lisa Swanson refused psychiatric interview.  Electronic Signatures: Kristine LineaPucilowska, Jamaul Heist (MD)  (Signed 02-Dec-14 12:24)  Authored: Brief Consult Note   Last Updated: 02-Dec-14 12:24 by Kristine LineaPucilowska, Jupiter Boys (MD)

## 2014-08-07 NOTE — H&P (Signed)
PATIENT NAME:  Lisa Swanson, Lisa Swanson MR#:  [id number] DATE OF BIRTH:  [birth year]  DATE OF ADMISSION:  11/02/2012  REFERRING PHYSICIAN: Dr. Cinda Quest   FAMILY PHYSICIAN: Dr. Kary Kos.   REASON FOR ADMISSION: Diabetic ketoacidosis.   HISTORY OF PRESENT ILLNESS: The patient is an 19 year old female with a history of type 1 diabetes who presents to the Emergency Room with upper respiratory complaints including congestion, cough and sore throat. In the Emergency Room, the patient was noted to be extremely dehydrated and tachycardic. She was found to have a blood sugar greater than 400 and was acidotic, consistent with diabetic ketoacidosis. She is now admitted for further evaluation.   PAST MEDICAL HISTORY: 1.  Type 1 diabetes.  2.  History of MRSA.   MEDICATIONS: 1.  Lantus 45 units subcutaneous at bedtime.  2.  Humalog per sliding scale with meals.   ALLERGIES:  IODINE AND PENICILLIN.   SOCIAL HISTORY: Negative for alcohol or tobacco abuse.   FAMILY HISTORY:  Positive for diabetes, but otherwise unremarkable.   REVIEW OF SYSTEMS:   CONSTITUTIONAL: No fever or change in weight.  EYES: No blurred or double vision. No glaucoma.  EAR, NOSE, THROAT: No tinnitus or hearing loss. No nasal discharge or bleeding. Has had dry mouth and difficulty swallowing.  RESPIRATORY: Positive cough. No wheezing or hemoptysis. No painful respiration.   CARDIOVASCULAR: No chest pain or orthopnea. No palpitations or syncope.  GASTROINTESTINAL: The patient has had nausea, vomiting, but denies diarrhea. No abdominal pain or change in bowel habits.  GENITOURINARY: No dysuria or hematuria. No incontinence.  ENDOCRINE: The patient has had polydipsia, but no polyuria. No nocturia. No heat or cold intolerance.  HEMATOLOGIC: The patient denies anemia, easy bruising or bleeding.  LYMPHATIC: No swollen glands.  MUSCULOSKELETAL: The patient denies pain in her neck, back, shoulders, knees, hips. No gout.  NEUROLOGIC: The  patient denies numbness or migraines. No stroke or seizures. Does have weakness.  PSYCHIATRIC: The patient denies anxiety, insomnia or depression.   PHYSICAL EXAMINATION: GENERAL: The patient is acutely ill-appearing, in moderate distress.  VITAL SIGNS: Currently remarkable for a blood pressure of 129/74 with a heart rate of 133, respiratory rate of 22 and a temperature of 98.7.  HEENT: Normocephalic, atraumatic. Pupils equally round and reactive to light and accommodation. Extraocular movements are intact. Sclerae are nonicteric. Conjunctivae are clear. Oropharynx is dry, but clear.  NECK: Supple without JVD. No adenopathy or thyromegaly is noted.  LUNGS: Reveals scattered rhonchi without wheezes or rales. No dullness. Respiratory effort is normal.  CARDIAC: A rapid rate with a regular rhythm. Normal S1, S2. No significant rubs, murmurs or gallops. PMI is nondisplaced. Chest wall is nontender.  ABDOMEN: Soft, nontender, with normoactive bowel sounds. No organomegaly or masses were appreciated. No hernias or bruits were noted.  EXTREMITIES: Without clubbing, cyanosis or edema. Pulses were 2+ bilaterally.  SKIN: Warm and dry, without rash or lesions.  NEUROLOGIC: Cranial nerves II through XII grossly intact. Deep tendon reflexes were symmetric. Motor and sensory exam is nonfocal.  PSYCHIATRIC: Exam revealed a patient who was alert and oriented to person, place and time. She was cooperative and used good judgment.   LABORATORY DATA: ABG revealed a pH of 6.92. Her platelets were elevated at 538. Glucose 492 with a BUN of 13, creatinine of 1.07 with a sodium of 131 and a potassium of 4.9. Alk phos was 305.   ASSESSMENT: 1.  Diabetic ketoacidosis.  2.  Tachycardia.  3.  Hyponatremia.  4.  Mild hyperkalemia.  5.  Presumed upper respiratory infection.  6.  History of methicillin-resistant Staphylococcus aureus.   PLAN: The patient will be admitted to the intensive care unit with IV fluids and  insulin drip. We will follow her sugars hourly and adjust the drip per protocol. We will begin empiric IV antibiotics at this time. We will repeat a chest x-ray in the morning after hydration. We will attempt a clear liquid diet for now. We will use Zofran as needed for nausea. Follow up routine labs in the morning. Vital signs q.4 hours for now. Further treatment and evaluation will depend upon the patient's progress.   Total time spent on this patient was 50 minutes.     ____________________________ Leonie Douglas Doy Hutching, MD jds:nts D: 11/02/2012 20:29:44 ET T: 11/02/2012 23:07:40 ET JOB#: [id number]  cc: Leonie Douglas. Doy Hutching, MD, <Dictator> Irven Easterly. Kary Kos, MD  Darica Goren Lennice Sites MD ELECTRONICALLY SIGNED 11/03/2012 12:33

## 2014-08-08 NOTE — H&P (Signed)
PATIENT NAME:  Lisa Swanson, GAUSE MR#:  [id number] DATE OF BIRTH:  Aug 23, 1995  DATE OF ADMISSION:  05/29/2013  REFERRING PHYSICIAN:  Dr. Jasmine December.   PRIMARY CARE PHYSICIAN:  Nonlocal.   CHIEF COMPLAINT:  Abdominal pain.   HISTORY OF PRESENT LILNESS:  A 19 year old Caucasian female with past medical history of type 1 diabetes presenting with abdominal pain.  She describes acute onset of abdominal pain located over left upper quadrant.  She describes the pain as sharp, nonradiating, associated with nausea, vomiting, vomiting x 1, nonbloody, nonbilious emesis.  Denies any diarrhea or constipation.  Denies any recent sick contacts.  She came to the Emergency Department with concern that this is her spleen being enlarged.  During initial work-up she was found to have a glucose greater than 1000.  She has been a diabetic since the age of 19.  She is on standing dose of insulin, states that she has been compliant with all medications.  No recent illnesses other than abdominal pain as mentioned above.  Currently complaining only of abdominal pain.   REVIEW OF SYSTEMS: CONSTITUTIONAL:  Denies fever, fatigue, weakness.  EYES:  Denied blurred vision, double vision, eye pain.  EARS, NOSE, THROAT:  Denies tinnitus, ear pain, hearing loss. RESPIRATORY:  Denies cough, wheeze, shortness of breath.  CARDIOVASCULAR:  Denies chest pain, palpitations, edema.  GASTROINTESTINAL:  Positive for nausea, vomiting x 1 as well as abdominal pain as described above.  Denies any diarrhea, constipation.  GENITOURINARY:  Denies dysuria, hematuria.  ENDOCRINE:  Denies nocturia or thyroid problems. HEMATOLOGY AND LYMPHATIC:  Denies easy bruising, bleeding.  SKIN:  Denies rash or lesions.  MUSCULOSKELETAL:  Denies pain in neck, back, shoulder, knees, hips, arthritic symptoms.  NEUROLOGIC:  Denies paralysis, paresthesias.  PSYCHIATRIC:  Denies anxiety or depressive symptoms.  Otherwise, full review of systems performed by me  is negative.   PAST MEDICAL HISTORY:  Type 1 diabetes originally diagnosed around age 75, follows with endocrinologist Dr. Gabriel Carina, also has history of MRSA infection.   SOCIAL HISTORY:  Denies alcohol, tobacco or drug use.   FAMILY HISTORY:  Positive for diabetes.   ALLERGIES:  CLINDAMYCIN, IODINE AND PENICILLIN.   HOME MEDICATIONS:  Include acetaminophen oxycodone 325/5 mg by mouth q. 6 hours as needed for pain, Levemir 22 units subcutaneous daily insulin sliding scale, Novolin 70/30, Flexeril 10 mg by mouth 3 times daily as needed.  Lactobacillus acidophilus two caps by mouth twice daily.   PHYSICAL EXAMINATION: VITAL SIGNS:  Temperature 97.4, heart rate 98, respirations 20, blood pressure 128/67, saturating 100% on room air.  Weight 65.8 kg, BMI 23.4.   GENERAL:  Well-nourished, well-developed, Caucasian female currently in no acute distress.  HEAD:  Normocephalic, atraumatic.  EYES:  Pupils equal, round, reactive to light.  Extraocular muscle intact.  No scleral icterus.  MOUTH:  Moist mucous membranes membrane.  Dentition intact.  No abscess noted.  EAR, NOSE, THROAT:  Throat clear without exudates.  No external lesions.  NECK:  Supple.  No thyromegaly.  No nodules.  No JVD.  PULMONARY:  Clear to auscultation bilaterally without wheezes, rubs or rhonchi.  No use of accessory muscles.  CHEST:  Nontender to palpation.  Good respiratory effort.  CARDIOVASCULAR:  S1, S2, regular rate and rhythm.  No murmurs, rubs, or gallops.  No edema.  Pedal pulse 2+ bilaterally.  GASTROINTESTINAL:  Soft, minimal tenderness to palpation on the left upper quadrant, nondistended.  No masses.  Positive bowel sounds.  No hepatosplenomegaly.  No  rebound tenderness.  No guarding.  No motion tenderness.  MUSCULOSKELETAL:  No swelling, clubbing, edema.  Range of motion full in all extremities. NEUROLOGIC:  Cranial nerves II through XII intact.  No gross focal neurological deficits.  Sensation intact.  Reflexes  intact.  SKIN:  No ulcerations, lesions, rash, cyanosis.  Skin warm, dry.  Turgor intact.  PSYCHIATRIC:  Mood and affect within normal limits.  The patient is awake, alert, oriented x 3.  Insight and judgment intact.   LABORATORY DATA:  Sodium 120, potassium 4.2, chloride 88, bicarb 24, BUN 11, creatinine 0.96, anion gap of 8, albumin of 3.6, glucose 1086.  Beta HCG is less than 1.  Total protein 7.3, albumin 3.6, bilirubin 0.4, alk phos 285, AST 27, ALT 29.  WBC 4.6, hemoglobin 13.2, platelets 310.  Urinalysis positive for glucosuria, negative for any evidence of infection.   ASSESSMENT AND PLAN:  A 19 year old Caucasian female with history of type 1 diabetes presenting with abdominal pain, found to have a blood glucose greater than 1000. 1.  Hyperosmolar, nonketotic diabetes, started on insulin drip at 0.1 units/kg per hour.  We will titrate down to 0.05 units/kg per hour when glucose reaches less than 300.  IV fluid hydration with normal saline with 20 mEq of potassium added at 250 mL an hour.  We will change this fluid to D5 half normal saline at 200 mL an hour when glucose reaches less than 300.  BMP q. 4 hours.  Fingersticks q. 1 hour.  When she is able to tolerate by mouth diet can restart her diet.  Start subcutaneous insulin at her home dose of 22 units of Levemir or if her 24 hours insulin requirement is higher, she will need a higher dosage than this and then after receiving subQ insulin can discontinue insulin drip 1 hour afterwards.  2.  Transaminitis, receiving abdominal ultrasound as ordered by ER physician.  3.  VTE prophylaxis with heparin subQ.  4.  CODE STATUS:  THE PATIENT IS FULL CODE.  Critical care time spent 55 minutes.    ____________________________ Aaron Mose. Yash Cacciola, MD dkh:ea D: 05/30/2013 01:03:08 ET T: 05/30/2013 01:45:24 ET JOB#: [id number]  cc: Aaron Mose. Kallee Nam, MD, <Dictator> Judi Jaffe Woodfin Ganja MD ELECTRONICALLY SIGNED 05/30/2013 21:04

## 2014-08-08 NOTE — H&P (Signed)
PATIENT NAME:  Lisa PerchCARWILE, Aleshia F MR#:  [id number] DATE OF BIRTH:  [birth year]  DATE OF ADMISSION:  04/22/2013  CHIEF COMPLAINT: Neck abscess recurrence.   HISTORY OF PRESENT ILLNESS: This patient has had several days of an increasingly painful neck abscess. She was in the ER last week on Wednesday and had it drained, but it has recurred, is worsening. She has some low-grade fevers. No nausea, vomiting. She has been on oral clindamycin. She has type 1 diabetes.   PAST MEDICAL HISTORY: Type 1 diabetes and kidney stones.   PAST SURGICAL HISTORY: Kidney surgery and prior incision and drainage of this neck abscess.   ALLERGIES: PENICILLIN AND IODINE, BOTH CAUSING BREATHING PROBLEMS.   MEDICATIONS: Insulin analogs.   FAMILY HISTORY: Noncontributory.   SOCIAL HISTORY: She does not smoke or drink. She works at the [employer] on [address].   REVIEW OF SYSTEMS: A 10-system review has been performed and negative with the exception of that mentioned in the HPI.   PHYSICAL EXAMINATION: GENERAL: Healthy, thin female patient with a BMI of 24.  VITAL SIGNS: Temperature of 97.4, pulse of 125, respirations 20, blood pressure 119/86, 98% room air sat. Pain scale of 10.  HEENT: No scleral icterus.  NECK: No palpable neck nodes. Posteriorly, there is extensive erythema extending from a midline posterior neck wound, with no expressible pus but very tender. That erythema extends on both sides of the neck, more to the left than right.  CHEST: Clear to auscultation.  CARDIAC: Regular rate and rhythm.  ABDOMEN: Soft, nontender.  EXTREMITIES: Without edema.  NEUROLOGIC: Grossly intact.  INTEGUMENT: No jaundice. Erythema is noted as above on the head and neck exam.   LABORATORY DATA:  No labs are available at this point. They have been drawn. No glucose is available.   ASSESSMENT AND PLAN: This is a patient with a recurrent neck abscess requiring further incision and drainage formally in the operating  room. Unfortunately, the patient ate a pickle at 3:00 and has been drinking Sprite up until 10 minutes ago. I would like to admit her to the hospital, give her an IV fluid bolus and pain medication. I believe her tachycardia is likely due to pain. She is not febrile at this point, but starting clindamycin will be of value. Depending on the cultures, we may want to add vancomycin, but I would not do that with her diabetes and not knowing her creatinine at this point. The options were discussed with the patient and her friends. The is the rationale for admission to the hospital was discussed, and the inability to proceed directly to the operating room was discussed due to her n.p.o. status, and the risks of bleeding, infection, open wound, cosmetic deformity, possible drain placement, and recurrence were all reviewed. She understood and agreed to proceed.   ____________________________ Adah Salvageichard E. Excell Seltzerooper, MD rec:jcm D: 04/22/2013 18:34:47 ET T: 04/22/2013 19:12:11 ET JOB#: [id number]  cc: Adah Salvageichard E. Excell Seltzerooper, MD, <Dictator> Lattie HawICHARD E Donice Alperin MD ELECTRONICALLY SIGNED 04/23/2013 10:17

## 2014-08-08 NOTE — Consult Note (Signed)
PATIENT NAME:  Lisa Swanson, Lisa Swanson MR#:  [id number] DATE OF BIRTH:  [birth year]  MEDICAL CONSULTATION   DATE OF CONSULTATION:  05/03/2013  REFERRING PHYSICIAN:  Carmie End, MD CONSULTING PHYSICIAN:  Starleen Arms, MD  PRIMARY CARE PHYSICIAN: Dr. Robby Sermon.   PRIMARY ENDOCRINOLOGIST: Macy Mis, MD  REASON FOR MEDICAL CONSULTATION: Management of diabetes mellitus.   HISTORY OF PRESENT ILLNESS: This is a 19 year old female with known past medical history of type 1 diabetes mellitus, insulin-dependent, on Levemir and NovoLog sliding scale at home. The patient with known history of MRSA. The patient was recently discharged status post I and D of nuchal/neck abscess before 10 days. The patient was admitted electively yesterday by Dr. Michela Pitcher for repeat I and D. The patient was admitted yesterday evening, went for the procedure yesterday, where she was found to have necrotic tissue and copious amount of purulent discharge which was drained and debrided. Hospitalists were requested to consult on the patient for uncontrolled diabetes mellitus. The patient did not receive her evening dose of Levemir yesterday. Her fingersticks were uncontrolled overnight, ranging between low 200s to 400s. The patient denies any fever, any chills, any nausea, any vomiting. Reports she can tolerate p.o. intake. Currently, she is on clear liquid diet. The patient had leukocytosis at 11.3. The patient reports she is following with Dr. Tedd Sias as an outpatient and supposed to be started on insulin drip in 2 weeks from today. The patient is known to have history of MRSA. She is on clindamycin for her nuchal abscess.   PAST MEDICAL HISTORY:  1. Type 1 diabetes.  2. History of previous MRSA in the past.   HOME MEDICATIONS:  1. Levemir 22 units at bedtime.  2. Humalog sliding scale. She is on 10 units for fingersticks less than 300, and 15 units for fingersticks more than 300 before meals.   ALLERGIES: IODINE AND  PENICILLIN.   SOCIAL HISTORY: Negative for alcohol, tobacco or drug use.   FAMILY HISTORY: Significant for diabetes.   REVIEW OF SYSTEMS:  CONSTITUTIONAL: The patient denies fever, chills, weight gain, weight loss, fatigue or weakness.  EYES: Denies blurry vision, double vision, inflammation, glaucoma.  ENT: Denies tinnitus, ear pain, hearing loss or nasal discharge.  RESPIRATORY: Denies any cough, wheezing, hemoptysis.  CARDIOVASCULAR: Denies any chest pain, edema, palpitation, syncope.  GASTROINTESTINAL: Denies nausea, vomiting, diarrhea, abdominal pain, jaundice.  GENITOURINARY: Denies dysuria, hematuria, renal colic.  ENDOCRINE: Denies polyuria or polydipsia, heat or cold intolerance. Has type 1 diabetes mellitus, insulin-dependent.  SKIN: Denies any rash or itching besides her nuchal abscess.  MUSCULOSKELETAL: Denies any joint effusion, erythema or gout.  NEUROLOGIC: Denies any focal deficits, tingling, numbness, weakness or vertigo.  PSYCHIATRIC: Denies anxiety, insomnia or depression.   PHYSICAL EXAMINATION:  VITAL SIGNS: Temperature 98, pulse 84, respiratory rate 17, blood pressure 94/62, saturating 97% on room air.  GENERAL: Well-nourished female. She is comfortable in bed, in no apparent distress.  HEENT: Head atraumatic, normocephalic. Pupils equally reactive to light. Pink conjunctivae. Anicteric sclerae. Moist oral mucosa.  NECK: The patient has bandage around her neck postoperatively.  CHEST: Good air entry bilaterally. No wheezing, rales, rhonchi.  CARDIOVASCULAR: S1, S2 heard. No rubs, murmurs or gallops.  ABDOMEN: Soft, nontender, nondistended. Bowel sounds present.  EXTREMITIES: No edema. No clubbing. No cyanosis. Pedal pulses felt bilaterally.  PSYCHIATRIC: Appropriate affect. Awake and alert x3. Intact judgment and insight. NEUROLOGIC: Cranial nerves grossly intact. Motor 5 out of 5. No focal deficits.  SKIN:  Normal skin turgor. Warm and dry.   PERTINENT  LABORATORIES: Most recent fingerstick was 334. White blood cells 11.3, hemoglobin 11.7, hematocrit 35.7, platelets 522. BMP still pending.   ASSESSMENT AND PLAN:  1. Uncontrolled diabetes mellitus. This is most likely currently uncontrolled due to her infection as well due to her not receiving her evening dose of Levemir. The first thing we will do, will check BMP to be sure the patient is not in diabetic ketoacidosis, even though clinically she does not look like it. Will resume her Levemir 22 units, and due to the fact she missed it in the evening, I will give it this morning and continue giving it during the morning, and upon discharge, she can be switched to the evening dose. Will have her on high scale insulin sliding scale from 3 to 16 units. Will give her fluid bolus normal saline x2. Most likely, the patient will need to be discharged home at a higher dose than her home dose of Levemir as she reports her fingersticks at home are usually uncontrolled, usually around 200.  2. Neck abscess. The patient is known to have history of methicillin-resistant Staphylococcus aureus. She is on clindamycin. Will add Lactobacillus for her.  3. Deep vein thrombosis prophylaxis. The patient on sequential compression device. Will start her on subcutaneous heparin.   TOTAL TIME SPENT ON PATIENT CARE AND MEDICAL CONSULTATION: 40 minutes.   ____________________________ Starleen Armsawood S. Isaiah Torok, MD dse:lb D: 05/03/2013 07:48:24 ET T: 05/03/2013 09:01:54 ET JOB#: [id number]  cc: Starleen Armsawood S. Lorita Forinash, MD, <Dictator> Sylus Stgermain Teena IraniS Trent Gabler MD ELECTRONICALLY SIGNED 05/04/2013 5:19

## 2014-08-08 NOTE — Consult Note (Signed)
PATIENT NAME:  Lisa Swanson, Lisa Swanson MR#:  [id number] DATE OF BIRTH:  1995-08-14  DATE OF CONSULTATION:  05/30/2013  REQUESTING PHYSICIAN:  Milagros LollSrikar Sudini, MD CONSULTING PHYSICIAN:  A. Wendall MolaMelissa Solum, MD  CHIEF COMPLAINT: Uncontrolled diabetes.   HISTORY OF PRESENT ILLNESS: This is a 19 year old female with type 1 diabetes admitted yesterday with abdominal pain. She was found to have an initial venous blood sugar of 1086 in the ER and she was not acidotic. Anion gap was normal. She was admitted for severe hyperglycemia. She was initiated on insulin drip and then transitioned to insulin by injections. Blood sugars have significantly improved. She is tolerating a diet. She denies nausea. The patient claims that she checks her blood sugars and she has been seeing sugars between the 100 to 200 range. However, she is well known to me she has long been very noncompliant with self-monitoring of blood sugars. She has also been noncompliant with her insulin and has had multiple hospitalizations for DKA. She has not been found to have any source of infection and a right upper quadrant ultrasound was a fairly unremarkable study.   PAST MEDICAL HISTORY: 1.  Type 1 diabetes mellitus.  2.  Recurrent DKA.   OUTPATIENT MEDICATIONS: 1.  Levemir 25 units at bedtime.  2.  NovoLog 6 units t.i.d. before meals in addition to a NovoLog correction of 15 units of premeal sugars greater than 15.  3.  Nexplanon contraceptive implant.  SOCIAL HISTORY: Single. Denies use of tobacco.   FAMILY HISTORY: Positive for heart disease, diabetes, ovarian cancer and colon cancer.   ALLERGIES: PENICILLIN.  REVIEW OF SYSTEMS:  GENERAL: No weight loss. No fatigue.  HEENT: No blurred vision. No sore throat.  NECK: No neck pain. No dysphagia.  CARDIAC: No chest pain. No palpitation.  PULMONARY: No cough. No shortness of breath.  ABDOMEN: Tolerating diet. Denies nausea.  EXTREMITIES: Denies leg swelling. ENDOCRINE:  Denies heat or  cold intolerance.  HEMATOLOGIC: Denies easy bruisability or recent bleeding.   PHYSICAL EXAMINATION: VITAL SIGNS: Height 65.9 inches, weight 145 pounds. Temperature 98, pulse 78, blood pressure 97/62, respirations 13, pulse ox 100% on room air.  GENERAL: Well-developed white female in no acute distress.  HEENT: EOMI. Oropharynx is clear.  NECK: Supple. No thyromegaly. No palpable thyroid nodules.  LYMPHATIC: No submandibular lymphadenopathy.  CARDIAC: Regular rate and rhythm. No murmur.  PULMONARY: Clear to auscultation bilaterally. No wheeze or rhonchi.  ABDOMEN: Diffusely soft, nontender, nondistended.  EXTREMITIES: No edema is present.  SKIN: No rash or dermatopathy is present.  PSYCHIATRIC: Calm, cooperative.  NEUROLOGIC: Nonfocal, no deficits. EOMI.  SKIN: No rash or dermatopathy.   LABORATORY DATA: Labs at 8:00 a.m. this morning include glucose 125, BUN 7, creatinine 0.6. Sodium 143, potassium 3.3, chloride 110, bicarb 26, calcium 7.4.   ASSESSMENT: 1.  Severe hyperglycemia in the setting of long-term uncontrolled type 1 diabetes.  2.  Long-term medication noncompliance.   RECOMMENDATIONS: 1.  Agree with resumption of her outpatient insulin regimen to include Levemir 25 units at bedtime and NovoLog 6 units before meals, in addition to correction insulin for high premeal blood sugars.  2.  We have planned to order her an insulin pump for some time. She tells me today that she only sent in her paperwork yesterday. I suspect in the next several weeks we will be able to get her started on an insulin pump, which will certainly improve not only insulin delivery but also my ability to oversee her compliance.  3.  The patient will have routine followup in the [facility] in the next 1 to 2 months.   Thank you for the kind request for consultation.   ____________________________ A. Wendall Mola, MD ams:ce D: 05/30/2013 14:37:51 ET T: 05/30/2013 15:01:34  ET JOB#: [id number]  cc: A. Wendall Mola, MD, <Dictator> Macy Mis MD ELECTRONICALLY SIGNED 06/04/2013 21:10

## 2014-08-08 NOTE — Discharge Summary (Signed)
PATIENT NAME:  Lisa PerchCARWILE, Burnis F MR#:  [id number] DATE OF BIRTH:  05/06/1995  DATE OF ADMISSION:  04/22/2013 DATE OF DISCHARGE:  04/24/2013  BRIEF HISTORY: Ms. [name] is a 19 year old girl with severe type 1 diabetes admitted to the hospital with a large abscess in the back of her neck. She had had an attempt at incision and drainage in the Emergency Room earlier in the week without success and her symptoms continued. She had been placed on Bactrim without improvement in her symptoms. She had eaten early in the afternoon, so she was admitted to the hospital and after appropriate delay for her meal, she was taken to surgery late in the evening on January 06. She underwent incision and drainage procedure. Multiple counter incisions were made and drains placed. She was placed on Cleocin. She had some mild pain control issues. The internal medicine service assisted in the management of her diabetes. She has had significant problems recently controlling her diabetes,  some of which revolves around noncompliance. She had some psychiatric issues during her last admission and was advised to seek help, but she refused psychiatric consultation in the hospital. She is doing better this evening. Her wounds look good. There is no sign of any infection advancing and she seems to be recovering from her surgery. She is discharged home this morning and will be followed in the office in 3 to 5 days for drain removal.   DISCHARGE MEDICATIONS: Include Levemir 22 units at bedtime, acetaminophen/hydrocodone 325/5 mg every 6 hours p.r.n., Cleocin 300 mg every 8 hours, potassium 20 mEq once a day and Lactobacillus.   FINAL DISCHARGE DIAGNOSES:  1.  Nuchal abscess. 2.  Diabetes mellitus type 1, insulin-dependent.   SURGERY: Incision and drainage. ____________________________ Quentin Orealph L. Ely III, MD rle:aw D: 04/24/2013 06:43:21 ET T: 04/24/2013 06:56:39 ET JOB#: [id number]  cc: Quentin Orealph L. Ely III, MD, <Dictator> A. Wendall MolaMelissa  Solum, MD Quentin OreALPH L ELY MD ELECTRONICALLY SIGNED 04/24/2013 23:23

## 2014-08-08 NOTE — Consult Note (Signed)
PATIENT NAME:  Lisa Swanson, Lisa Swanson MR#:  [id number] DATE OF BIRTH:  Aug 19, 1995  DATE OF CONSULTATION:  04/23/2013  REFERRING PHYSICIAN:      Dionne Milo, M.D.    CONSULTING PHYSICIAN:  Starleen Arms, MD  PRIMARY CARE PHYSICIAN: Dr. Robby Sermon. PRIMARY ENDOCRINOLOGIST: Dr. Tedd Sias.  REASON FOR MEDICAL CONSULTATION: Management of diabetes mellitus.   HISTORY OF PRESENT ILLNESS: This is a 19 year old female here with a past medical history of type 1 diabetes mellitus on Levemir and insulin analog scale at home, history of MRSA, patient was admitted last evening for neck abscess status post incision and drainage of nuchal abscess by Dr. Michela Pitcher this morning. Medicine were consulted for management of diabetes mellitus. The patient's glucose upon admission was 143 and last value was 302. The patient denies any fever, any chills, any nausea, any vomiting. She was resumed on a clear liquid diet, but she is sleepy and not going to have any p.o. She is following with Dr. Tedd Sias, reports she is supposed to be on an insulin pump in the next four weeks, with her next follow up. The patient labs were significant for hypokalemia at 3, for hyponatremia at 127, which appears to be pseudohyponatremia because was once it is corrected, her glucose level is within normal limits. The patient was started on clindamycin, as she is known to have history of MRSA. The patient had fever at 100.6, most recent blood pressure 98/57.   PAST MEDICAL HISTORY: 1.  Type 1 diabetes.  2.  History of previous MRSA in the past.   HOME MEDICATIONS: 1.   Levemir 22 units at bedtime.  2.  Humalog sliding scale. She is in on 10 units before meals and if her fingersticks above 300, she takes 15 units before meals.   ALLERGIES: IODINE AND PENICILLIN.    SOCIAL HISTORY: Negative for alcohol, tobacco or drug use.   FAMILY HISTORY: Significant for diabetes.   REVIEW OF SYSTEMS: CONSTITUTIONAL: The patient denies any chills, weight  gain, weight loss. Complains of fatigue and weakness.  EYES: Denies blurry vision, double vision, inflammation.  ENT: Denies tinnitus, difficulty of hearing or nasal discharge.  RESPIRATORY: Denies any cough, wheezing, hemoptysis.  CARDIOVASCULAR: Denies chest pain, edema, palpitation.  GASTROINTESTINAL: Denies nausea, vomiting, diarrhea, abdominal pain.  GENITOURINARY: Denies dysuria, hematuria or renal colic.  ENDOCRINE: Denies polyuria, polydipsia, heat or cold intolerance. Has history of diabetes. type 1.  SKIN: The patient reports skin abscess, denies any other skin lesions.  MUSCULOSKELETAL: Denies any joint effusion or erythema or ambulatory dysfunction.  NEUROLOGIC: Denies any focal deficits, tingling, numbness, weakness, dizziness, vertigo, headache, neck.  PSYCHIATRIC: Denies anxiety, insomnia, depression.   PHYSICAL EXAMINATION: VITAL SIGNS:  Temperature 99.3, pulse 129, respiratory rate 18, blood pressure 98/57, saturating 96% on room air.  GENERAL: Young female, lies comfortably in bed, in no apparent distress.  HEENT: Head is atraumatic, normocephalic. Pupils equal, reactive to light. Pink conjunctivae. Anicteric sclerae. Dry oral mucosa.  NECK: The patient had gauze wrapped around her neck post-surgical procedure.  CHEST: Good air entry bilaterally. No wheezing, rales, rhonchi.  CARDIOVASCULAR: S1, S2 heard. No rubs, murmur or gallops.  ABDOMEN: Soft, nontender, nondistended. Bowel sounds present.  EXTREMITIES: No edema. No clubbing. No cyanosis, wearing sequential compression device.  PSYCHIATRIC: The patient was sleepy, but when she woke up, she is awake, alert x 3. Intact judgment and insight.  NEUROLOGIC: Cranial nerves grossly intact. Motor 5 out of 5. No focal deficits.  SKIN: Normal skin turgor.  Warm and dry.  Physical exam was done with the presence of the patient's registered nurse.   PERTINENT LABORATORY DATA: Glucose 443, BUN 15, creatinine 0.64, sodium 127,  potassium 3, chloride 96, CO2 17. Last fingersticks 302. ALT 20, AST 17, alka phos 368, albumin 2.7. White blood cell 15.2, hemoglobin 13.9, hematocrit 40.5, platelet 524. Urinalysis negative for leukocyte esterase and nitrite.   ASSESSMENT AND PLAN: 1.  Uncontrolled diabetes mellitus, this is most likely related to her infection. As well, the patient did not receive her bedtime Levemir. The patient is on usually 22 at bedtime.  She is on clear liquid diet, but she is sleeping, not going to take any p.o. right now, so we will give her two thirds of her dose which is around 14 units right now and from tomorrow, we will have her on 20 units, but, I do think she will need a higher dose. We will adjust the dose depends on how much or NovoLog she is requiring during the daytime. We will put her back on sliding scale before meals, but not at bedtime, on the largest scale from 6 to 20 unit's sliding scale. The patient is followed Dr. Tedd SiasSolum at baseline if needed. We will consult her.  2. Hypokalemia. We will replace. We will repeat level in a.m. As well, we will check magnesium level.  3.   Hypernatremia, appears to be pseudohyponatremia secondary to hyperglycemia.  4.  Neck abscess, status post incision and drainage. The patient is on clindamycin. As she has known history of Methicillin-resistant Staph aureus, we will add Lactobacillus. 5.  Tachycardia, appears to be sinus tachycardia that is due to her infection. We will give her one more fluids bolus of normal saline.  6.  Deep vein thrombosis prophylaxis. The patient is on sequential compression device. Will start her on subcutaneous heparin.  7.  Elevated alkaline phosphatase. The patient has not had abdominal pain or tenderness, appears to be chronically elevated, we will check liver function tests with a.m. labs and, if not improving with hydration, she might need of right upper quadrant ultrasound.   Total time spent on medical consultation:  55 minutes.   ____________________________ Starleen Armsawood S. Markeis Allman, MD dse:NTS D: 04/23/2013 02:58:58 ET T: 04/23/2013 03:35:06 ET JOB#: [id number]  cc: Starleen Armsawood S. Claire Bridge, MD, <Dictator> Faust Thorington Teena IraniS Zori Benbrook MD ELECTRONICALLY SIGNED 04/24/2013 14:34

## 2014-08-08 NOTE — Discharge Summary (Signed)
PATIENT NAME:  Lisa Swanson, Lisa Swanson MR#:  [id number] DATE OF BIRTH:  15-Aug-1995  DATE OF ADMISSION:  05/30/2013 DATE OF DISCHARGE:  05/30/2013  PRIMARY CARE PROVIDER AND ENDOCRINOLOGIST: Dr. Tedd SiasSolum.   DISCHARGE DIAGNOSES: 1.  Hyperosmolar hyperglycemic state.  2.  Uncontrolled diabetes mellitus.  3.  Noncompliance.  4.  Pseudohyponatremia.  5.  Mild hypokalemia.   CONSULTANTS: Dr. Tedd SiasSolum with endocrinology.   ADMITTING HISTORY AND PHYSICAL AND HOSPITAL COURSE: Please see detailed H and P dictated previously and also the consult by Dr. Tedd SiasSolum. In brief, a 19 year old female patient with history of type 1 diabetes mellitus on Levemir and NovoLog presented to the hospital complaining of uncontrolled diabetes. The patient also had nausea and vomiting and mild epigastric pain. The patient was found to have blood sugars greater than 1000, was started on insulin drip for hyperosmolar hyperglycemic state, admitted to CCU. The patient improved well with IV fluid resuscitation and insulin drip. Later was transitioned to her home dose of Levemir and also NovoLog t.i.d. 6 units. The patient was seen by Dr. Tedd SiasSolum who is her endocrinologist. I discussed with her in person and she mentioned that the patient has had long history of noncompliance with medications and also multiple social issues. The patient presently is waiting for an insulin pump which should prevent further admissions to the hospital. The patient was seen by Dr. Tedd SiasSolum who suggested continuing home dose. Presently her blood sugars are trending down into 200 and is being discharged back home after being counseled to be compliant with her medications.   Presently on examination, she does not have any abdominal tenderness. Lungs sound clear. S1, S2 on auscultation heard.  DISCHARGE MEDICATIONS: Include:  1.  Levemir 25 units subcutaneous once a day at bedtime.  2.  NovoLog subcutaneous sliding scale and 6 units 3 times a day before meals.  3.  Zofran  4 mg oral 4 times a day as needed for nausea.   DISCHARGE INSTRUCTIONS: Carbohydrate-controlled diet. Activity as tolerated. Follow up with Dr. Tedd SiasSolum in 1 to 2 weeks in the office and be compliant with medications.   TIME SPENT: On day of discharge in discharge activity was 35 minutes. ____________________________ Molinda BailiffSrikar R. Dakota Vanwart, MD srs:sb D: 05/30/2013 16:09:13 ET T: 05/30/2013 16:40:05 ET JOB#: [id number]  cc: Wardell HeathSrikar R. Zya Finkle, MD, <Dictator> Orie FishermanSRIKAR R Carollyn Etcheverry MD ELECTRONICALLY SIGNED 06/05/2013 14:09

## 2014-08-08 NOTE — H&P (Signed)
   Subjective/Chief Complaint neck abscess   History of Present Illness sev days, prior I&D wednesday no f/c worsening pain   Past History type I DM kidney stones  PSH kidney surgery, I&D   Past Medical Health Diabetes Mellitus   Past Med/Surgical Hx:  MRSA:   type I diabetes:   kidney stone:   ALLERGIES:  PCN: Unknown  Iodine: Anaphylaxis  Family and Social History:  Family History Non-Contributory   Social History negative tobacco, negative ETOH, [place]   Place of Living Home   Review of Systems:  Fever/Chills No   Cough No   Abdominal Pain No   Diarrhea No   Nausea/Vomiting No   SOB/DOE No   Chest Pain No   Dysuria No   Tolerating Diet Yes   Medications/Allergies Reviewed Medications/Allergies reviewed   Physical Exam:  GEN no acute distress   HEENT erythema post, wound v tender   NECK masses present   RESP normal resp effort  clear BS   CARD regular rate  tachy   ABD denies tenderness   LYMPH negative neck   EXTR negative edema   SKIN normal to palpation   PSYCH alert, A+O to time, place, person, good insight    Assessment/Admission Diagnosis rec neck abscess I&D in OR tonight or in am admit start IV clindamycin check labs   Electronic Signatures: Lattie Hawooper, Maeci Kalbfleisch E (MD)  (Signed 06-Jan-15 18:38)  Authored: CHIEF COMPLAINT and HISTORY, PAST MEDICAL/SURGIAL HISTORY, ALLERGIES, FAMILY AND SOCIAL HISTORY, REVIEW OF SYSTEMS, PHYSICAL EXAM, ASSESSMENT AND PLAN   Last Updated: 06-Jan-15 18:38 by Lattie Hawooper, Twinkle Sockwell E (MD)

## 2014-08-08 NOTE — Op Note (Signed)
PATIENT NAME:  Lisa Swanson, Lisa Swanson MR#:  [id number] DATE OF BIRTH:  Nov 15, 1995  DATE OF PROCEDURE:  05/02/2013  PREOPERATIVE DIAGNOSIS: Nuchal abscess.    POSTOPERATIVE DIAGNOSIS: Nuchal abscess.  OPERATION: Incision and drainage.   ANESTHESIA: General.   SURGEON: Quentin Orealph L. Ely, MD  OPERATIVE PROCEDURE: With the patient in the supine position, she was appropriately induced with general anesthesia, then placed in the right side down/left side up position, held in place by the beanbag. The previous Penrose drains were removed. A large amount of purulent material exited the drain sites, which appeared to be connected with the right neck abscess. The area was prepped with ChloraPrep and draped with sterile towels. The new abscess was higher in the midline, but there appeared to be significantly increased drainage from all of the other sites, particularly on the right side. The lateral aspect of the right abscess was opened sharply, and a small amount of purulent material removed, but in massaging that area, a large amount of purulent material was removed from the midline. In fact, there were large pieces of necrotic tissue. The area was examined. There did not appear to be any evidence of fasciitis or rapidly advancing infection. An incision was made in the superior abscess, and it did allow for the drainage of a significant amount of purulent material. A counter-incision was made on the other side. The 2 abscess cavities connected in the midline. The left lateral aspect appeared to be healing nicely, without evidence of significant abscess formation or recurrence. I considered placing a wound VAC, but thought in this position a wound VAC would be very difficult. At the present time, there was no evidence for a fasciitis or any need for any major skin debridement. I still felt these abscesses should heal with proper drainage. The sites were irrigated with 2 liters of warm saline solution. No further  purulence was noted. Penrose drains were placed through the new sites, and the previous sites were simply dressed. A sterile dressing was applied with Kerlix ABD pads, and 4 x 4s. The patient was awakened and returned to the recovery room, and tolerated the procedure well. Sponge and needle count were correct x 2 in the operating room.    ____________________________ Quentin Orealph L. Ely III, MD rle:mr D: 05/02/2013 20:26:49 ET T: 05/02/2013 22:20:11 ET JOB#: [id number]  cc: Quentin Orealph L. Ely III, MD, <Dictator> Quentin OreALPH L ELY MD ELECTRONICALLY SIGNED 05/05/2013 0:57

## 2014-08-08 NOTE — Op Note (Signed)
PATIENT NAME:  Lisa PerchCARWILE, Jennavieve F MR#:  [id number] DATE OF BIRTH:  Aug 09, 1995  DATE OF PROCEDURE:  04/23/2013  PREOPERATIVE DIAGNOSES: Nuchal abscess, type 1 diabetes.   POSTOPERATIVE DIAGNOSES: Nuchal abscess, type 1 diabetes.   PROCEDURE PERFORMED: Incision and drainage.   ANESTHESIA: General.   SURGEON: Carmie Endalph L. Ely III, M.D.   OPERATIVE PROCEDURE: With the patient in the supine position after the induction of appropriate general anesthesia, the patient was placed in the right side down position. She was placed in a beanbag device to properly hold her in place. The neck was prepped with ChloraPrep and draped with sterile towels. There was drainage from the site of the previous abscess drainage. This area was cultured. That area was opened and irrigated. There were several small pockets, all of which were broken up with a combination of blunt and manual pressure. Multiple counterincisions were made and Penrose drains placed. Drains secured with 3-0 nylon. Sterile dressings were applied. The patient was returned to the recovery room, having tolerated the procedure well. Sponge, instrument and needle counts were correct x 2 in the operating room.   ____________________________ Quentin Orealph L. Ely III, MD rle:gb D: 04/23/2013 00:22:00 ET T: 04/23/2013 00:36:54 ET JOB#: [id number]  cc: Quentin Orealph L. Ely III, MD, <Dictator> Quentin OreALPH L ELY MD ELECTRONICALLY SIGNED 04/24/2013 23:23

## 2014-12-09 IMAGING — CR DG CHEST 1V PORT
1 series · 1 of 1 positions shown · non-contrast
Comparison: 11/02/12

CLINICAL DATA: Shortness of breath.  Diabetes.

EXAM:
PORTABLE CHEST - 1 VIEW

[ap]
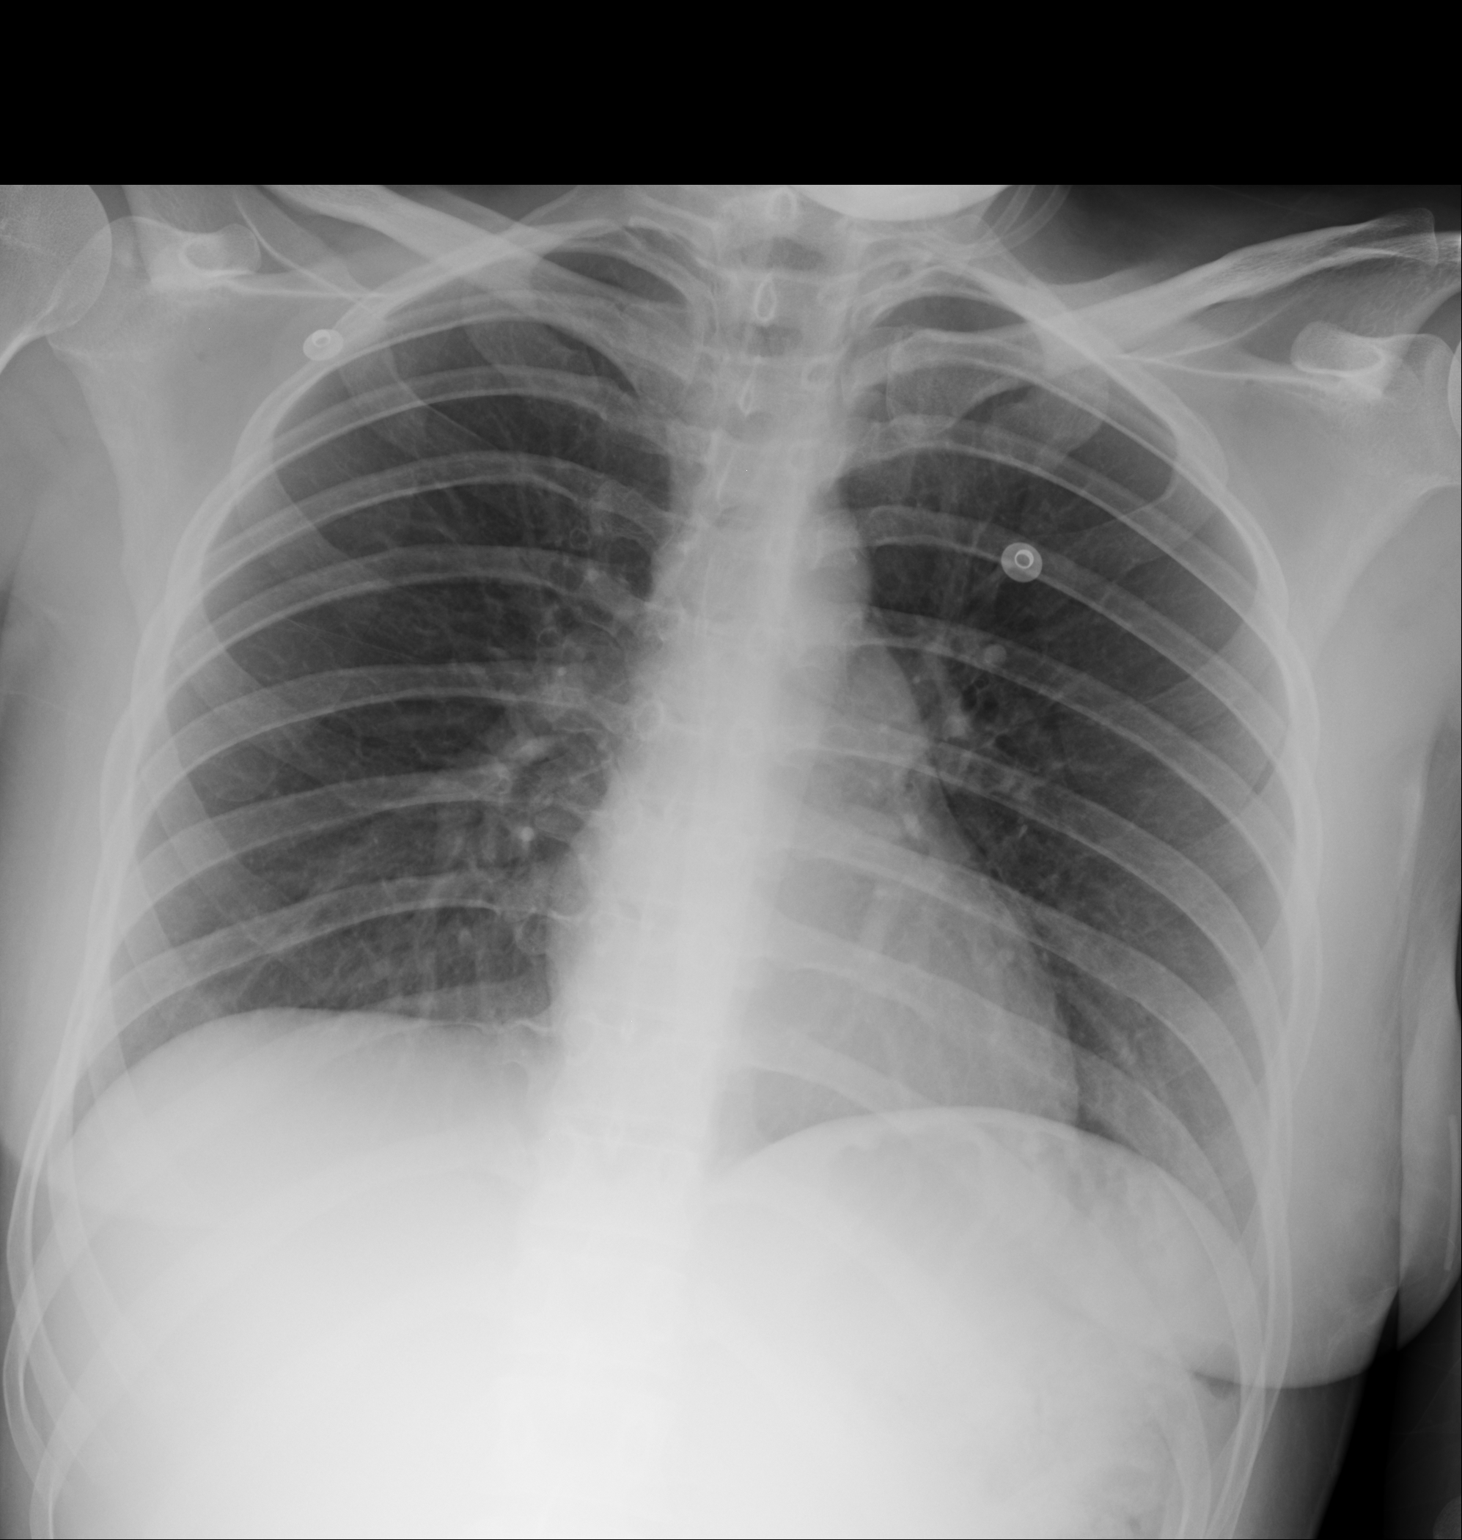

[1 of 1 positions shown; findings below may reference images not displayed]

FINDINGS: Midline trachea. Normal heart size and mediastinal contours. No
pleural effusion or pneumothorax. Clear lungs.
IMPRESSION: No active disease.
